# Patient Record
Sex: Male | Born: 1955 | Race: Black or African American | Hispanic: No | Marital: Single | Smoking: Never smoker
Health system: Southern US, Community
[De-identification: ages and names within clinical notes are randomized; demographics above are authoritative.]

## PROBLEM LIST (undated history)

## (undated) DIAGNOSIS — F419 Anxiety disorder, unspecified: Secondary | ICD-10-CM

## (undated) DIAGNOSIS — I10 Essential (primary) hypertension: Secondary | ICD-10-CM

## (undated) DIAGNOSIS — F32A Depression, unspecified: Secondary | ICD-10-CM

## (undated) DIAGNOSIS — E785 Hyperlipidemia, unspecified: Secondary | ICD-10-CM

## (undated) DIAGNOSIS — F329 Major depressive disorder, single episode, unspecified: Secondary | ICD-10-CM

## (undated) HISTORY — DX: Essential (primary) hypertension: I10

## (undated) HISTORY — DX: Depression, unspecified: F32.A

## (undated) HISTORY — DX: Anxiety disorder, unspecified: F41.9

## (undated) HISTORY — DX: Hyperlipidemia, unspecified: E78.5

## (undated) HISTORY — PX: OTHER SURGICAL HISTORY: SHX169

## (undated) HISTORY — DX: Major depressive disorder, single episode, unspecified: F32.9

---

## 2003-04-10 ENCOUNTER — Encounter (INDEPENDENT_AMBULATORY_CARE_PROVIDER_SITE_OTHER): Payer: Self-pay | Admitting: Internal Medicine

## 2006-03-20 ENCOUNTER — Emergency Department: Payer: Self-pay | Admitting: Unknown Physician Specialty

## 2006-08-06 ENCOUNTER — Ambulatory Visit: Payer: Self-pay | Admitting: Family Medicine

## 2007-01-27 ENCOUNTER — Encounter (INDEPENDENT_AMBULATORY_CARE_PROVIDER_SITE_OTHER): Payer: Self-pay | Admitting: Internal Medicine

## 2007-01-27 DIAGNOSIS — E785 Hyperlipidemia, unspecified: Secondary | ICD-10-CM | POA: Insufficient documentation

## 2007-01-27 DIAGNOSIS — F528 Other sexual dysfunction not due to a substance or known physiological condition: Secondary | ICD-10-CM | POA: Insufficient documentation

## 2007-01-27 DIAGNOSIS — F329 Major depressive disorder, single episode, unspecified: Secondary | ICD-10-CM

## 2007-01-27 DIAGNOSIS — F3289 Other specified depressive episodes: Secondary | ICD-10-CM | POA: Insufficient documentation

## 2007-01-27 DIAGNOSIS — I10 Essential (primary) hypertension: Secondary | ICD-10-CM | POA: Insufficient documentation

## 2007-01-27 DIAGNOSIS — M542 Cervicalgia: Secondary | ICD-10-CM

## 2007-01-27 DIAGNOSIS — F411 Generalized anxiety disorder: Secondary | ICD-10-CM | POA: Insufficient documentation

## 2007-01-27 LAB — CONVERTED CEMR LAB: Cholesterol, target level: 200 mg/dL

## 2007-01-28 ENCOUNTER — Ambulatory Visit: Payer: Self-pay | Admitting: Family Medicine

## 2007-01-28 DIAGNOSIS — R6882 Decreased libido: Secondary | ICD-10-CM

## 2007-01-29 ENCOUNTER — Ambulatory Visit: Payer: Self-pay | Admitting: Internal Medicine

## 2007-01-30 LAB — CONVERTED CEMR LAB
ALT: 13 units/L (ref 0–40)
AST: 16 units/L (ref 0–37)
Albumin: 4 g/dL (ref 3.5–5.2)
Calcium: 9.3 mg/dL (ref 8.4–10.5)
Chloride: 106 meq/L (ref 96–112)
Direct LDL: 155 mg/dL
GFR calc Af Amer: 83 mL/min
GFR calc non Af Amer: 69 mL/min
PSA: 0.31 ng/mL (ref 0.10–4.00)
Total CHOL/HDL Ratio: 5
Triglycerides: 128 mg/dL (ref 0–149)
VLDL: 26 mg/dL (ref 0–40)

## 2007-02-23 ENCOUNTER — Telehealth (INDEPENDENT_AMBULATORY_CARE_PROVIDER_SITE_OTHER): Payer: Self-pay | Admitting: *Deleted

## 2007-03-02 ENCOUNTER — Ambulatory Visit: Payer: Self-pay | Admitting: Family Medicine

## 2007-03-02 DIAGNOSIS — M25519 Pain in unspecified shoulder: Secondary | ICD-10-CM

## 2007-03-03 ENCOUNTER — Encounter (INDEPENDENT_AMBULATORY_CARE_PROVIDER_SITE_OTHER): Payer: Self-pay | Admitting: Internal Medicine

## 2007-04-01 ENCOUNTER — Telehealth (INDEPENDENT_AMBULATORY_CARE_PROVIDER_SITE_OTHER): Payer: Self-pay | Admitting: *Deleted

## 2007-04-01 ENCOUNTER — Telehealth (INDEPENDENT_AMBULATORY_CARE_PROVIDER_SITE_OTHER): Payer: Self-pay | Admitting: Internal Medicine

## 2007-04-08 ENCOUNTER — Encounter (INDEPENDENT_AMBULATORY_CARE_PROVIDER_SITE_OTHER): Payer: Self-pay | Admitting: Internal Medicine

## 2007-07-31 ENCOUNTER — Encounter (INDEPENDENT_AMBULATORY_CARE_PROVIDER_SITE_OTHER): Payer: Self-pay | Admitting: *Deleted

## 2007-08-20 ENCOUNTER — Ambulatory Visit: Payer: Self-pay | Admitting: Family Medicine

## 2007-08-20 DIAGNOSIS — K5289 Other specified noninfective gastroenteritis and colitis: Secondary | ICD-10-CM

## 2008-01-20 ENCOUNTER — Encounter (INDEPENDENT_AMBULATORY_CARE_PROVIDER_SITE_OTHER): Payer: Self-pay | Admitting: Internal Medicine

## 2008-02-05 ENCOUNTER — Telehealth (INDEPENDENT_AMBULATORY_CARE_PROVIDER_SITE_OTHER): Payer: Self-pay | Admitting: Internal Medicine

## 2008-02-08 ENCOUNTER — Telehealth (INDEPENDENT_AMBULATORY_CARE_PROVIDER_SITE_OTHER): Payer: Self-pay | Admitting: *Deleted

## 2008-03-07 ENCOUNTER — Encounter (INDEPENDENT_AMBULATORY_CARE_PROVIDER_SITE_OTHER): Payer: Self-pay | Admitting: Internal Medicine

## 2008-03-09 ENCOUNTER — Encounter (INDEPENDENT_AMBULATORY_CARE_PROVIDER_SITE_OTHER): Payer: Self-pay | Admitting: Internal Medicine

## 2008-03-09 ENCOUNTER — Ambulatory Visit: Payer: Self-pay | Admitting: Family Medicine

## 2008-03-10 ENCOUNTER — Encounter: Admission: RE | Admit: 2008-03-10 | Discharge: 2008-03-10 | Payer: Self-pay | Admitting: Family Medicine

## 2008-03-10 ENCOUNTER — Ambulatory Visit: Payer: Self-pay | Admitting: Internal Medicine

## 2008-03-15 LAB — CONVERTED CEMR LAB
Alkaline Phosphatase: 64 units/L (ref 39–117)
Basophils Absolute: 0 10*3/uL (ref 0.0–0.1)
Bilirubin, Direct: 0.1 mg/dL (ref 0.0–0.3)
Calcium: 9.2 mg/dL (ref 8.4–10.5)
Cholesterol: 226 mg/dL (ref 0–200)
GFR calc Af Amer: 92 mL/min
GFR calc non Af Amer: 76 mL/min
Glucose, Bld: 101 mg/dL — ABNORMAL HIGH (ref 70–99)
HCT: 46.9 % (ref 39.0–52.0)
Hemoglobin: 15.8 g/dL (ref 13.0–17.0)
MCHC: 33.6 g/dL (ref 30.0–36.0)
Monocytes Absolute: 0.5 10*3/uL (ref 0.1–1.0)
Monocytes Relative: 7.9 % (ref 3.0–12.0)
Neutro Abs: 3 10*3/uL (ref 1.4–7.7)
Platelets: 289 10*3/uL (ref 150–400)
Potassium: 4.2 meq/L (ref 3.5–5.1)
RDW: 13.8 % (ref 11.5–14.6)
Sodium: 142 meq/L (ref 135–145)
TSH: 0.88 microintl units/mL (ref 0.35–5.50)
Total Bilirubin: 0.6 mg/dL (ref 0.3–1.2)
Total CHOL/HDL Ratio: 5.1
Total Protein: 6.5 g/dL (ref 6.0–8.3)
Triglycerides: 104 mg/dL (ref 0–149)
VLDL: 21 mg/dL (ref 0–40)

## 2008-03-16 ENCOUNTER — Telehealth (INDEPENDENT_AMBULATORY_CARE_PROVIDER_SITE_OTHER): Payer: Self-pay | Admitting: *Deleted

## 2008-03-31 ENCOUNTER — Ambulatory Visit (HOSPITAL_COMMUNITY): Admission: RE | Admit: 2008-03-31 | Discharge: 2008-04-01 | Payer: Self-pay | Admitting: Specialist

## 2008-04-09 HISTORY — PX: ANTERIOR CERVICAL DECOMP/DISCECTOMY FUSION: SHX1161

## 2008-05-23 ENCOUNTER — Encounter (INDEPENDENT_AMBULATORY_CARE_PROVIDER_SITE_OTHER): Payer: Self-pay | Admitting: Internal Medicine

## 2008-06-01 ENCOUNTER — Encounter (INDEPENDENT_AMBULATORY_CARE_PROVIDER_SITE_OTHER): Payer: Self-pay | Admitting: *Deleted

## 2008-06-09 ENCOUNTER — Telehealth (INDEPENDENT_AMBULATORY_CARE_PROVIDER_SITE_OTHER): Payer: Self-pay | Admitting: Internal Medicine

## 2008-07-07 ENCOUNTER — Ambulatory Visit: Payer: Self-pay | Admitting: Family Medicine

## 2008-08-26 ENCOUNTER — Encounter (INDEPENDENT_AMBULATORY_CARE_PROVIDER_SITE_OTHER): Payer: Self-pay | Admitting: Internal Medicine

## 2008-08-26 ENCOUNTER — Telehealth (INDEPENDENT_AMBULATORY_CARE_PROVIDER_SITE_OTHER): Payer: Self-pay | Admitting: Internal Medicine

## 2008-09-15 ENCOUNTER — Ambulatory Visit: Payer: Self-pay | Admitting: Family Medicine

## 2008-09-15 DIAGNOSIS — G4700 Insomnia, unspecified: Secondary | ICD-10-CM

## 2008-12-13 ENCOUNTER — Telehealth (INDEPENDENT_AMBULATORY_CARE_PROVIDER_SITE_OTHER): Payer: Self-pay | Admitting: Internal Medicine

## 2009-02-15 ENCOUNTER — Telehealth (INDEPENDENT_AMBULATORY_CARE_PROVIDER_SITE_OTHER): Payer: Self-pay | Admitting: Internal Medicine

## 2009-09-15 ENCOUNTER — Ambulatory Visit: Payer: Self-pay | Admitting: Family Medicine

## 2009-09-20 LAB — CONVERTED CEMR LAB
ALT: 14 units/L (ref 0–53)
AST: 18 units/L (ref 0–37)
CO2: 32 meq/L (ref 19–32)
Calcium: 9.3 mg/dL (ref 8.4–10.5)
Chloride: 105 meq/L (ref 96–112)
Glucose, Bld: 89 mg/dL (ref 70–99)
HDL: 44.3 mg/dL (ref >39.00)
Potassium: 4.1 meq/L (ref 3.5–5.1)
Sodium: 143 meq/L (ref 135–145)
Triglycerides: 91 mg/dL (ref 0.0–149.0)
VLDL: 18.2 mg/dL (ref 0.0–40.0)

## 2009-09-28 ENCOUNTER — Ambulatory Visit: Payer: Self-pay | Admitting: Family Medicine

## 2009-12-13 ENCOUNTER — Telehealth: Payer: Self-pay | Admitting: Family Medicine

## 2010-03-04 IMAGING — CR DG CHEST 2V
2 series · 2 of 2 positions shown · non-contrast
Comparison: None

CLINICAL DATA: Preoperative respiratory exam for neck surgery

CHEST - 2 VIEW

[w chest pa]
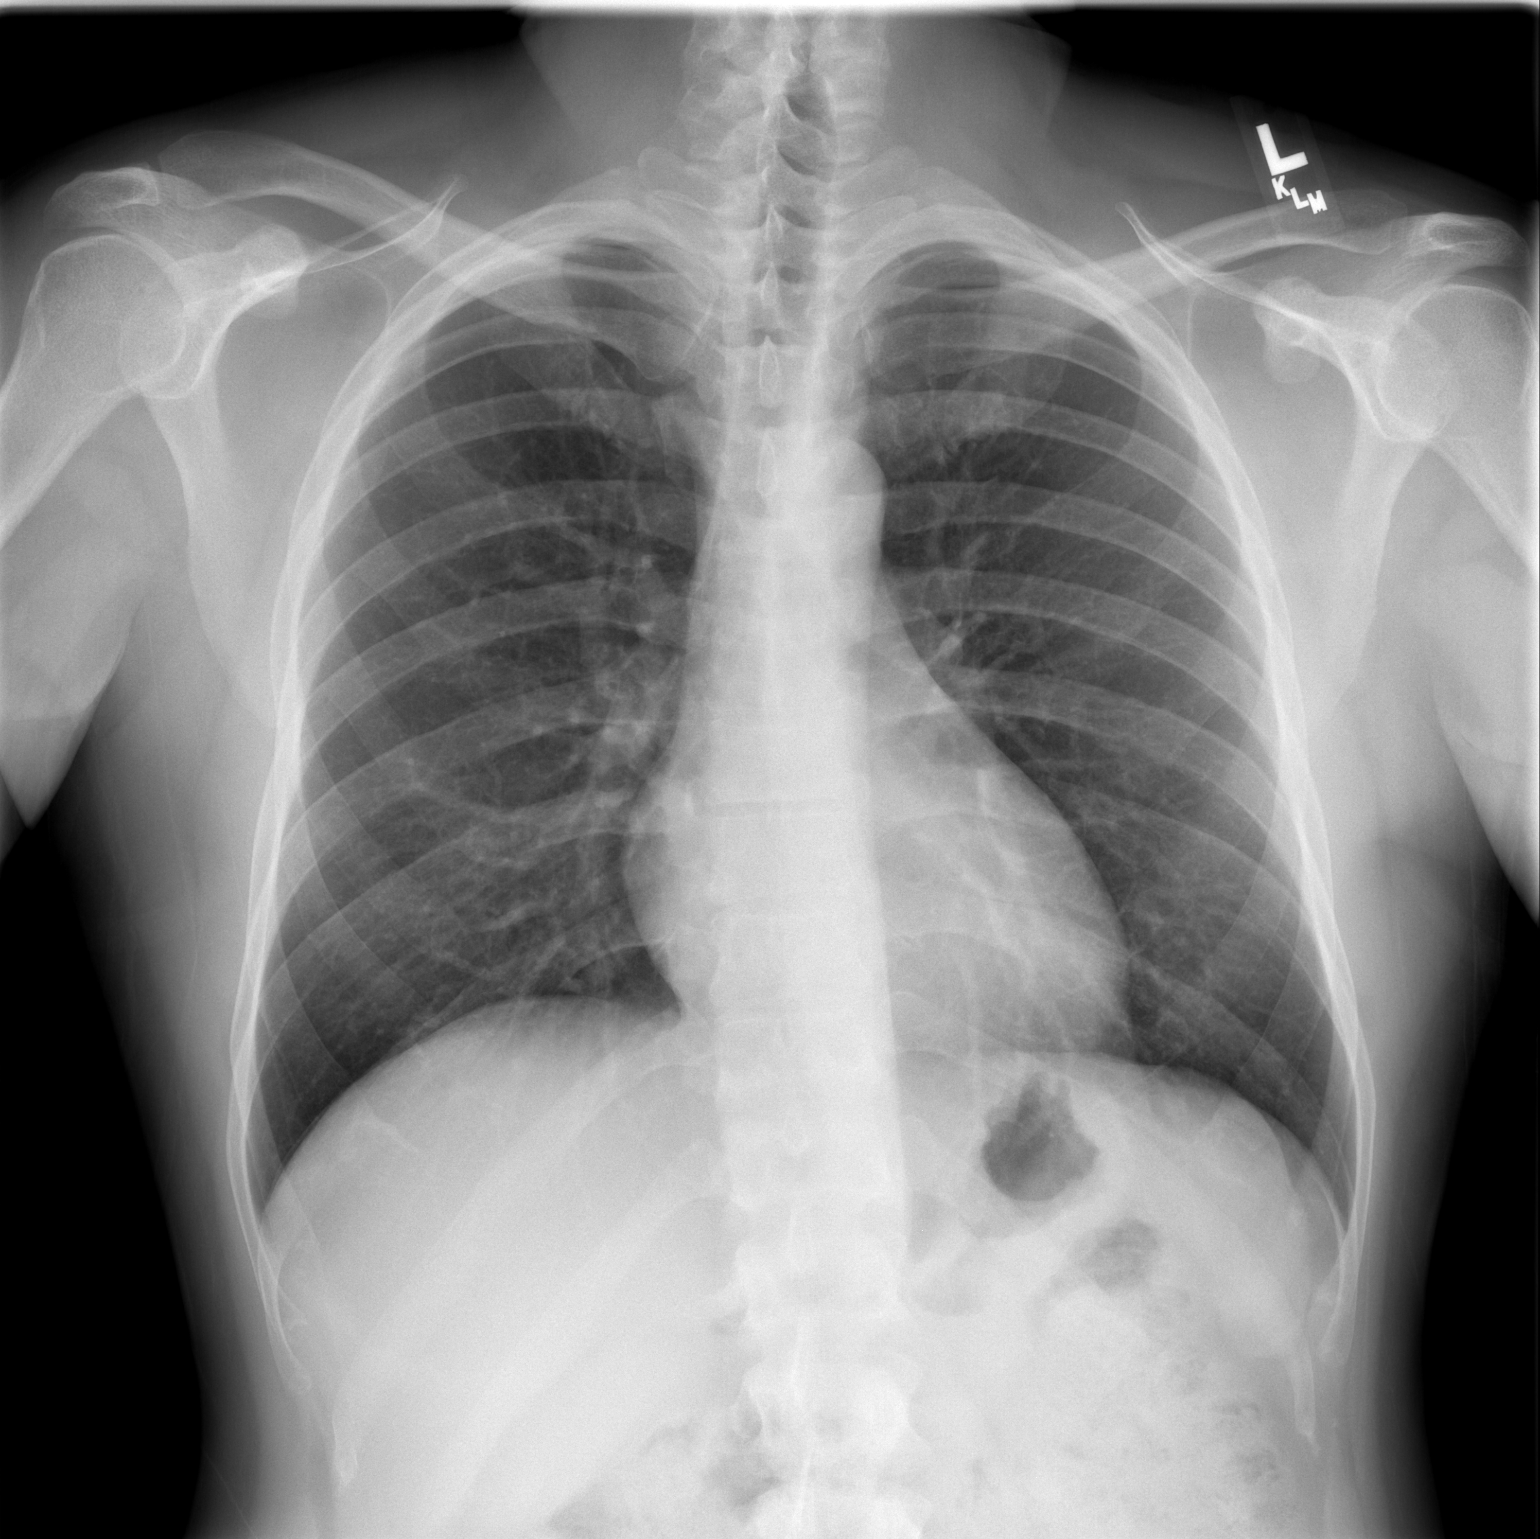

[w chest lat]
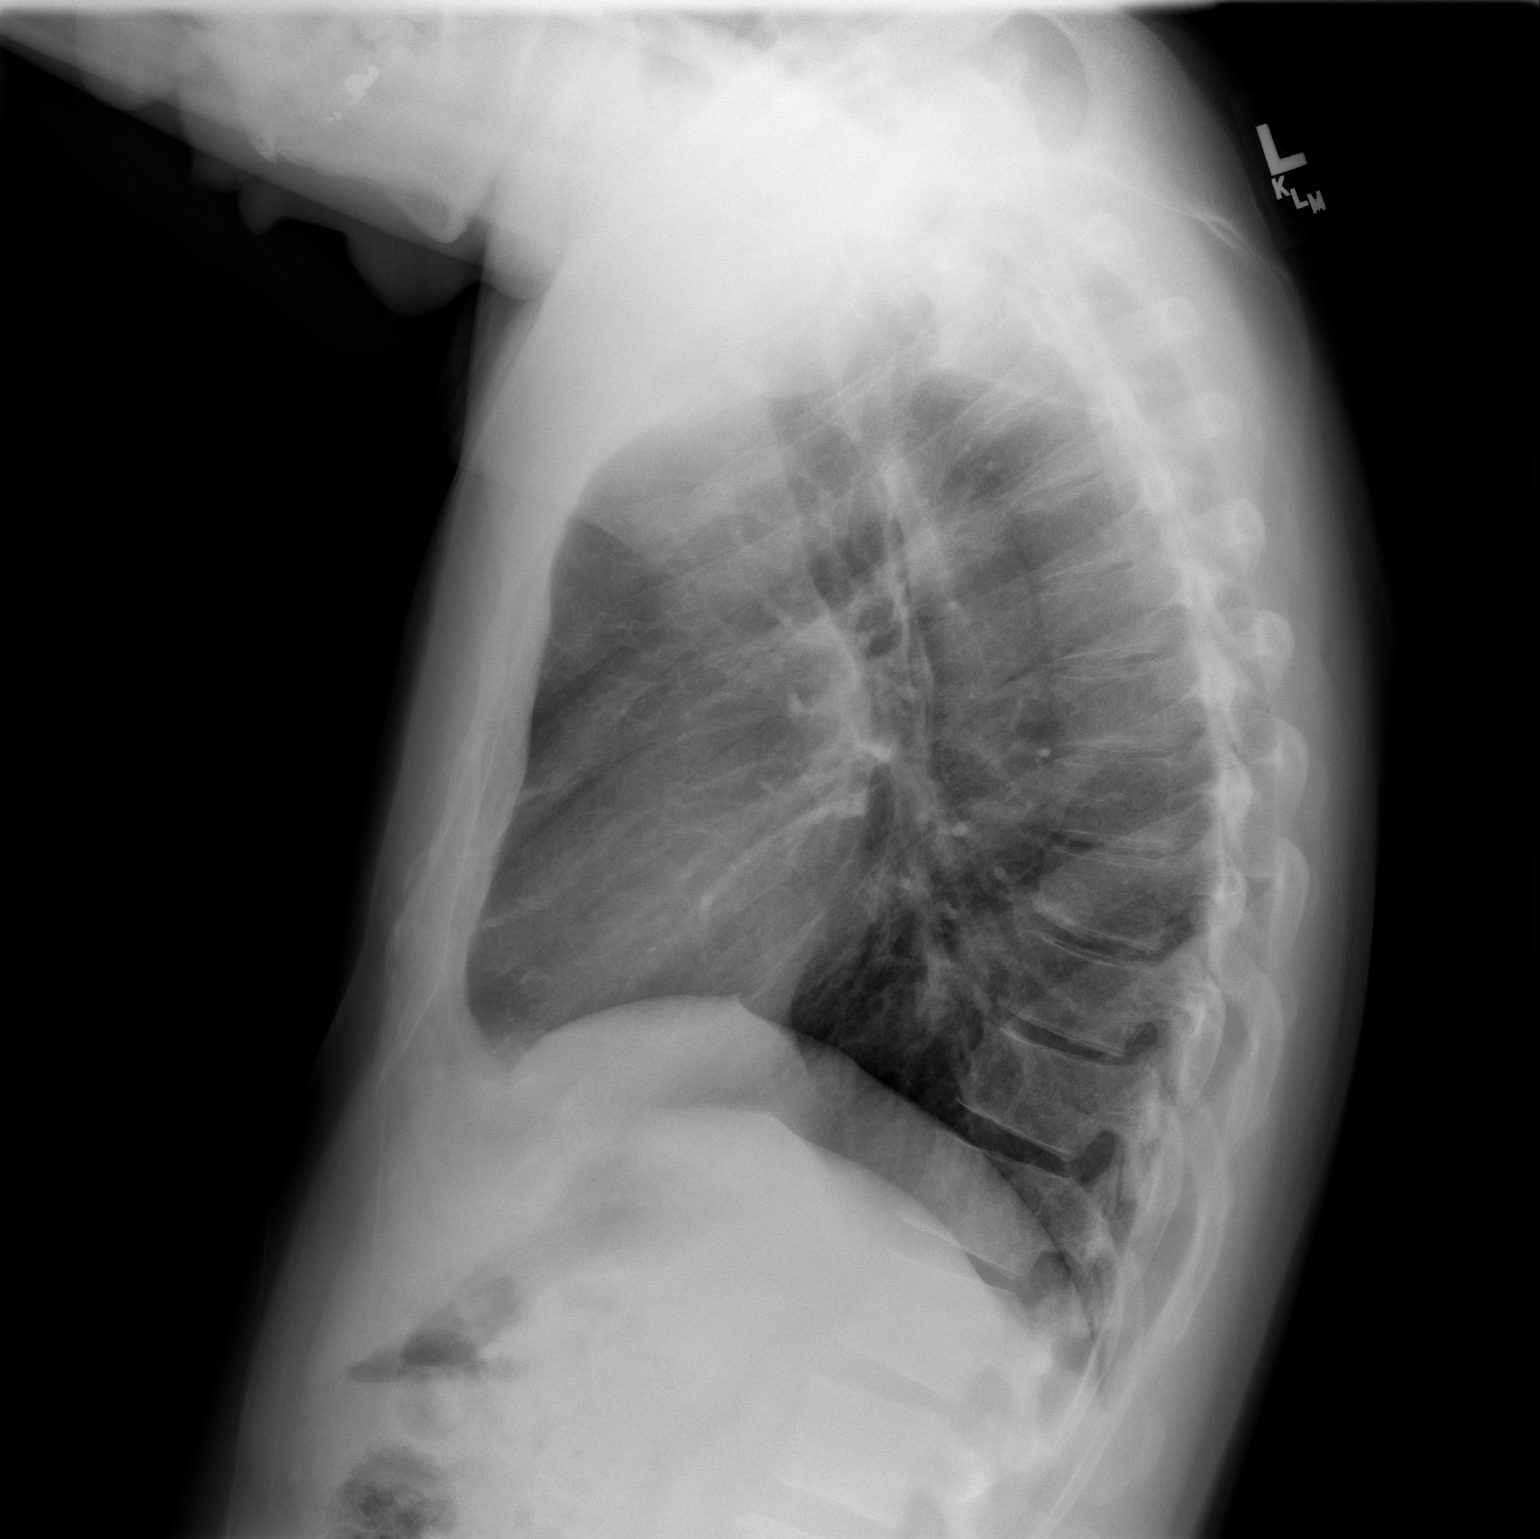

[2 of 2 positions shown; findings below may reference images not displayed]

FINDINGS: The heart size and mediastinal contours are within normal
limits.  Both lungs are clear.  The visualized skeletal structures
are unremarkable.
IMPRESSION: No active disease.

## 2010-08-27 ENCOUNTER — Telehealth: Payer: Self-pay | Admitting: Family Medicine

## 2010-10-09 NOTE — Assessment & Plan Note (Signed)
Summary: FOLLOW UP, DISCUSS LABS PER BILLIE/NT   Vital Signs:  Patient profile:   55 year old male Height:      67 inches Weight:      189.2 pounds BMI:     29.74 Temp:     98.3 degrees F oral Pulse rate:   72 / minute Pulse rhythm:   regular BP sitting:   106 / 72  (left arm) Cuff size:   regular  Vitals Entered By: Benny Lennert CMA Duncan Dull) (September 28, 2009 8:14 AM)  History of Present Illness: Chief complaint follow ups labs per billie  55 year old male:  Lipids: elevated, was on Zocor before, but stopped  Erections: Has not had sex for a couple of months, he is able to achieve an erection sometimes, not as hard, he can ejaculate with diminished ejaculate, is able to masturbate, but in general his erectile quality is poor.  At work, got some shortness of breath, laid back for a minute. Not associ with exercise. Started to get some sweating. Came down. Left his medicine at home that one time.  Only when missing medicine - atenolol  Has had a stress test that was normal  ? Testosterone deficiency question by Everrett Coombe who asked me to addresss.  Allergies: 1)  ! Pcn  Past History:  Past medical, surgical, family and social histories (including risk factors) reviewed, and no changes noted (except as noted below).  Past Medical History: Reviewed history from 01/27/2007 and no changes required. Hypertension Hyperlipidemia Anxiety Depression  Past Surgical History: Reviewed history from 08/26/2008 and no changes required. BONE SPURS, R) SHOULDER 2005 discectomy and fusion C3-4 and C4-5---8/09--Nitka  Family History: Reviewed history from 09/15/2008 and no changes required. Father:--did not know father  Mother: died of stomach cancer at age of 8  Siblings: 1 sis--died of ?--at age 12  Social History: Reviewed history from 09/15/2009 and no changes required. 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 GI: No noted N or V Otherwise, pertinent positives and negatives are noted in the HPI.   Physical Exam  General:  Well-developed,well-nourished,in no acute distress; alert,appropriate and cooperative throughout examination Head:  Normocephalic and atraumatic without obvious abnormalities. No apparent alopecia or balding. Ears:  no external deformities.   Nose:  no external deformity.   Neck:  No deformities, masses, or tenderness noted. Lungs:  Normal respiratory effort, chest expands symmetrically. Lungs are clear to auscultation, no crackles or wheezes. Heart:  Normal rate and regular rhythm. S1 and S2 normal without gallop, murmur, click, rub or other extra sounds. Extremities:  No clubbing, cyanosis, edema, or deformity noted with normal full range of motion of all joints.   Neurologic:  alert & oriented X3 and gait normal.   Cervical Nodes:  No lymphadenopathy noted Psych:  Cognition and judgment appear intact. Alert and cooperative with normal attention span and concentration. No apparent delusions, illusions, hallucinations   Impression & Recommendations:  Problem # 1:  HYPERLIPIDEMIA (ICD-272.4)  I would stop Crestor and start Pravachol - excellent medicine at a fraction of the cost  The following medications were removed from the medication list:    Crestor 40  Mg Tabs (Rosuvastatin calcium) .Marland Kitchen... 1 once daily for cholesterol His updated medication list for this problem includes:    Pravastatin Sodium 40 Mg Tabs (Pravastatin sodium) ..... One by mouth at bedtime  Labs Reviewed: SGOT: 18 (09/15/2009)   SGPT: 14 (09/15/2009)  Lipid Goals: Chol Goal: 200 (01/27/2007)    HDL Goal: 40 (01/27/2007)   LDL Goal: 130 (01/27/2007)   TG Goal: 150 (01/27/2007)  Prior 10 Yr Risk Heart Disease: Not enough information (01/27/2007)   HDL:44.30 (09/15/2009), 43.9 (03/10/2008)  LDL:DEL (03/10/2008), DEL (01/29/2007)  Chol:231 (09/15/2009), 226 (03/10/2008)  Trig:91.0 (09/15/2009), 104 (03/10/2008)  Problem # 2:  ERECTILE DYSFUNCTION (ICD-302.72) Start viagra trial, 1/2 tab prior to intercourse  His updated medication list for this problem includes:    Viagra 100 Mg Tabs (Sildenafil citrate) .Marland Kitchen... Take 1 tablet prior to intercourse, 30 minutes before  Problem # 3:  DECREASED LIBIDO (ICD-799.81) the patient does not meet diagnosis of hypogonadism  by the American endocrinological Association  following these guidelines, testosterone supplementation as not indicated. I discussed this with the patient, and his age, a testosterone of 353 is not markedly low.  It is minimally low, it may explain some of his libido issues as some of his fatigue  Complete Medication List: 1)  Atenolol 25 Mg Tabs (Atenolol) .... Take 1 tablet by mouth two times a day 2)  Effexor Xr 150 Mg Cp24 (Venlafaxine hcl) .... Once daily 3)  Verapamil Hcl Cr 360 Mg Cp24 (Verapamil hcl) .... Take 1 tablet by mouth once a day 4)  Ambien 10 Mg Tabs (Zolpidem tartrate) .... 1/2 to 1 at bedtime for sleep 5)  Effexor Xr 75 Mg Xr24h-cap (Venlafaxine hcl) .Marland Kitchen.. 1 every other day and 150 mg on odd days x 1-2 mo, then move to 1 once daily if tolerated 6)  Pravastatin Sodium 40 Mg Tabs (Pravastatin sodium) .... One by mouth at bedtime 7)  Viagra 100 Mg Tabs (Sildenafil citrate) .... Take 1 tablet prior to intercourse, 30 minutes before  Patient Instructions: 1)  f/u 3 months 2)  before, several days 3)  recheck: 4)  Hepatic Panel prior to visit ICD-9: v58.69 5)  Lipid panel prior to visit ICD-9 : 272.4 Prescriptions: VIAGRA 100 MG TABS (SILDENAFIL CITRATE) Take 1 tablet prior to intercourse, 30 minutes before   #10 x 11   Entered and Authorized by:   Hannah Beat MD   Signed by:   Hannah Beat MD on 09/28/2009   Method used:   [facility]        [facility]  [place] [id number]       [address]       [place], [place]  [address]       Ph: [phone] or [phone]       Fax: [phone]   RxID:   [id number] PRAVASTATIN SODIUM 40 MG  TABS (PRAVASTATIN SODIUM) one by mouth at bedtime  #30 x 5   Entered and Authorized by:   Hannah Beat MD   Signed by:   Hannah Beat MD on 09/28/2009   Method used:   [facility]        [facility]  [place] (retail)       [address]       [place], [place]  [address]       Ph: [phone] or [phone]       Fax: [phone]   RxID:   [id number]   Current Allergies (reviewed today): ! PCN  Appended  Document: FOLLOW UP, DISCUSS LABS PER BILLIE/NT Call patient  Make sure he is not taking Crestor  Billie wrote him for this, but it is the same type of medicine as the pravastatin for cholesterol, but it costs 10 times as much. I would take the generic.  Appended Document: FOLLOW UP, DISCUSS LABS PER BILLIE/NT Patient actually out of medication wants to change to prevastatin and [facility] [place]  Appended Document: FOLLOW UP, DISCUSS LABS PER BILLIE/NT he was given pravastatin for the 1st time yesterday, and was already sent [facility] [place]  ensure not taking Crestor  Appended Document: FOLLOW UP, DISCUSS LABS PER BILLIE/NT okay he said that he doesnt have any he ran out

## 2010-10-09 NOTE — Progress Notes (Signed)
Summary: refill request for ambien  Phone Note Refill Request Message from:  Fax from Pharmacy  Refills Requested: Medication #1:  AMBIEN 10 MG TABS 1/2 to 1 at bedtime for sleep   Last Refilled: 11/04/2009 Faxed request from [facility], phone [phone].  Initial call taken by: Lowella Petties CMA,  December 13, 2009 10:39 AM  Follow-up for Phone Call        Rx called to pharmacy Follow-up by: Benny Lennert CMA Duncan Dull),  December 13, 2009 12:21 PM    Prescriptions: AMBIEN 10 MG TABS (ZOLPIDEM TARTRATE) 1/2 to 1 at bedtime for sleep  #30 x 5   Entered and Authorized by:   Hannah Beat MD   Signed by:   Hannah Beat MD on 12/13/2009   Method used:   Telephoned to ...       [facility]  [place] (retail)       [address]       [place], [place]  [address]       Ph: [phone] or [phone]       Fax: [phone]   RxID:   [id number]

## 2010-10-09 NOTE — Assessment & Plan Note (Signed)
Summary: CPX/CLE   Vital Signs:  Patient profile:   55 year old male Height:      67 inches Weight:      187 pounds BMI:     29.39 Temp:     98.9 degrees F oral Pulse rate:   68 / minute Pulse rhythm:   regular BP sitting:   136 / 94  (left arm) Cuff size:   regular  Vitals Entered By: Lewanda Rife LPN (September 15, 2009 9:03 AM)  CC:  complete physical.  History of Present Illness: Here for CPX-- --feels is ready to get off Effexor--depressed at time of divorce, now feeling well, has daughter living with him --having problems with erectile dysfunction--reports this is not a new problem, he has used patches of testosterone in the past--would like to have his testosterone checked and tx if low    Preventive Screening-Counseling & Management  Alcohol-Tobacco     Alcohol drinks/day: 1     Alcohol type: beer     Smoking Status: quit     Packs/Day: 0.5     Year Started: 1973     Year Quit: 1998  Caffeine-Diet-Exercise     Caffeine use/day: 4     Does Patient Exercise: no  Problems Prior to Update: 1)  Insomnia  (ICD-780.52) 2)  Other Specified Pre-operative Examination  (ICD-V72.83) 3)  Gastroenteritis  (ICD-558.9) 4)  Shoulder Pain, Right  (ICD-719.41) 5)  Screening For Malignant Neoplasm, Prostate  (ICD-V76.44) 6)  Decreased Libido  (ICD-799.81) 7)  Well Adult  (ICD-V70.0) 8)  Thyroid Stimulating Hormone, Abnormal, Decreased  (ICD-246.9) 9)  Neck Pain  (ICD-723.1) 10)  Erectile Dysfunction  (ICD-302.72) 11)  Depression  (ICD-311) 12)  Anxiety  (ICD-300.00) 13)  Hyperlipidemia  (ICD-272.4) 14)  Hypertension  (ICD-401.9)  Medications Prior to Update: 1)  Atenolol 25 Mg Tabs (Atenolol) .... Take 1 Tablet By Mouth Two Times A Day 2)  Effexor Xr 150 Mg Cp24 (Venlafaxine Hcl) .... Once Daily 3)  Verapamil Hcl Cr 360 Mg Cp24 (Verapamil Hcl) .... Take 1 Tablet By Mouth Once A Day 4)  Crestor 40 Mg Tabs (Rosuvastatin Calcium) .Marland Kitchen.. 1 Once Daily For Cholesterol 5)  Ambien  10 Mg Tabs (Zolpidem Tartrate) .... 1/2 To 1 At Bedtime For Sleep  Allergies: 1)  ! 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[employer], 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, 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, shipping and receiving, 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  Risk Factors: Alcohol Use: 1 (09/15/2009) Caffeine Use: 4 (09/15/2009) Exercise: no (09/15/2009)  Risk Factors: Smoking Status: quit (09/15/2009) Packs/Day: 0.5 (09/15/2009) Passive Smoke Exposure: no (03/09/2008)  Social History: Marital Status: Married--09/2009--now divorced, daughter lives with him--now 16 Children: 2--13 daughter, 16 son 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 CV:  Denies chest pain or discomfort, palpitations, swelling of feet, and swelling of hands. Resp:  Denies cough, shortness of breath, and wheezing. GI:  Denies abdominal pain, bloody stools, constipation, diarrhea, indigestion, nausea, and vomiting. GU:  Complains of erectile dysfunction; denies discharge, incontinence, and nocturia. MS:  Denies joint pain and  muscle aches. Derm:  Complains of rash; denies lesion(s); spots all over--back, head, shoulders--used . Neuro:  Denies difficulty with concentration, disturbances in coordination, falling down, memory loss, seizures, tremors, and weakness. Psych:  See HPI; Denies anxiety and depression.  Physical Exam  General:  alert, well-developed, well-nourished, and well-hydrated.   Neck:  no masses, no thyromegaly, no JVD, and no carotid bruits.   Lungs:  normal respiratory effort, no intercostal retractions, no accessory muscle use, and normal breath sounds.   Heart:  normal rate, regular rhythm, and no murmur.   Abdomen:  soft, non-tender, normal bowel sounds, no distention, no masses, no abdominal hernia, no inguinal hernia, no hepatomegaly, and no splenomegaly.   Rectal:  normal sphincter tone and no masses.  guiac neg Prostate:   ~30mg , no nodules and no asymmetry.   Msk:  no joint swelling, no joint warmth, no redness over joints, and no joint deformities.   Extremities:  no edema either lower leg Neurologic:  alert & oriented X3, sensation intact to light touch, and gait normal.   Inguinal Nodes:  no R inguinal adenopathy and no L inguinal adenopathy.   Psych:  normally interactive and good eye contact.     Impression & Recommendations:  Problem # 1:  WELL ADULT (ICD-V70.0) well 55 yr old male will update immunizations--Tdap given discussed colonoscopy--wants to hold on this for now, no family hx of colon cancer, given stool cards  Problem # 2:  DECREASED LIBIDO (ICD-799.81) Assessment: Unchanged will check testosterone today--discussed with him his risk factors, ie HBP, elevated llipids--treated will discuss use of testosterone with Dr Hetty Ely based on lab results Orders: TLB-Testosterone, Total (84403-TESTO)  Problem # 3:  DEPRESSION (ICD-311) Assessment: Improved will titrate off effexor: to use 75 mg every other day with 150 on other days for 1-2 mo, then try to reduce to 75mg   on daily basis His updated medication list for this problem includes:    Effexor Xr 150 Mg Cp24 (Venlafaxine hcl) ..... Once daily    Effexor Xr 75 Mg Xr24h-cap (Venlafaxine hcl) .Marland Kitchen... 1 every other day and 150 mg on odd days x 1-2 mo, then move to 1 once daily if tolerated  Problem # 4:  HYPERTENSION (ICD-401.9) Assessment: Unchanged stable on current meds--continue see back in 6 mo or as needed  His updated medication list for this problem includes:    Atenolol 25 Mg Tabs (Atenolol) .Marland Kitchen... Take 1 tablet by mouth two times a day    Verapamil Hcl Cr 360 Mg Cp24 (Verapamil hcl) .Marland Kitchen... Take 1 tablet by mouth once a day  Orders: Venipuncture (86578) TLB-BMP (Basic Metabolic Panel-BMET) (80048-METABOL)  BP today: 136/94 Prior BP: 124/90 (09/15/2008)  Prior 10 Yr Risk Heart Disease: Not enough information (01/27/2007)  Labs Reviewed: K+: 4.2 (03/10/2008) Creat: : 1.1 (03/10/2008)   Chol: 226 (03/10/2008)   HDL: 43.9 (03/10/2008)   LDL: DEL (03/10/2008)   TG: 104 (03/10/2008)  Problem # 5:   HYPERLIPIDEMIA (ICD-272.4) Assessment: Unchanged no recent labs, will get labs today and titrate if needed His updated medication list for this problem includes:    Crestor 40 Mg Tabs (Rosuvastatin calcium) .Marland KitchenMarland KitchenMarland KitchenMarland Kitchen  1 once daily for cholesterol  Labs Reviewed: SGOT: 18 (03/10/2008)   SGPT: 13 (03/10/2008)  Lipid Goals: Chol Goal: 200 (01/27/2007)   HDL Goal: 40 (01/27/2007)   LDL Goal: 130 (01/27/2007)   TG Goal: 150 (01/27/2007)  Prior 10 Yr Risk Heart Disease: Not enough information (01/27/2007)   HDL:43.9 (03/10/2008), 49.1 (01/29/2007)  LDL:DEL (03/10/2008), DEL (01/29/2007)  Chol:226 (03/10/2008), 245 (01/29/2007)  Trig:104 (03/10/2008), 128 (01/29/2007)  Complete Medication List: 1)  Atenolol 25 Mg Tabs (Atenolol) .... Take 1 tablet by mouth two times a day 2)  Effexor Xr 150 Mg Cp24 (Venlafaxine hcl) .... Once daily 3)  Verapamil Hcl Cr 360 Mg Cp24 (Verapamil hcl) .... Take 1 tablet by  mouth once a day 4)  Crestor 40 Mg Tabs (Rosuvastatin calcium) .Marland Kitchen.. 1 once daily for cholesterol 5)  Ambien 10 Mg Tabs (Zolpidem tartrate) .... 1/2 to 1 at bedtime for sleep 6)  Effexor Xr 75 Mg Xr24h-cap (Venlafaxine hcl) .Marland Kitchen.. 1 every other day and 150 mg on odd days x 1-2 mo, then move to 1 once daily if tolerated  Other Orders: TLB-Lipid Panel (80061-LIPID) TLB-ALT (SGPT) (84460-ALT) TLB-AST (SGOT) (84450-SGOT) Tdap => 30yrs IM ([address]) Admin 1st Vaccine (60454) Prescriptions: EFFEXOR XR 75 MG XR24H-CAP (VENLAFAXINE HCL) 1 every other day and 150 mg on odd days x 1-2 mo, then move to 1 once daily if tolerated  #15 x 2   Entered and Authorized by:   Gildardo Griffes FNP   Signed by:   Gildardo Griffes FNP on 09/19/2009   Method used:   [facility]        [facility]  [place] [id number]       [address]       [place], [place]  [address]       Ph: [phone] or [phone]       Fax: [phone]   RxID:   [id number] AMBIEN 10 MG TABS (ZOLPIDEM TARTRATE) 1/2 to 1 at bedtime for sleep  #30 x 1   Entered and Authorized by:   Gildardo Griffes FNP   Signed by:   Gildardo Griffes FNP on 09/15/2009   Method used:   Print then Give to Patient   RxID:   [id number]   Current Allergies (reviewed today): ! PCN   Immunizations Administered:  Tetanus Vaccine:    Vaccine Type: Tdap    Site: right deltoid    Mfr: GlaxoSmithKline    Dose: 0.5 ml    Route: IM    Given by: Lewanda Rife LPN    Exp. Date: 11/04/2011    Lot #: [id number]    VIS given: 07/28/07 version given September 15, 2009.

## 2010-10-11 NOTE — Progress Notes (Signed)
Summary: Remus Loffler  Phone Note Refill Request Message from:  Fax from Pharmacy on August 27, 2010 9:49 AM  Refills Requested: Medication #1:  AMBIEN 10 MG TABS 1/2 to 1 at bedtime for sleep   Last Refilled: 06/05/2010 [facility] [phone].  Initial call taken by: Melody Comas,  August 27, 2010 9:49 AM Call For: Hannah Beat MD  Follow-up for Phone Call        Rx called to pharmacy Follow-up by: Benny Lennert CMA Duncan Dull),  August 27, 2010 10:11 AM    Prescriptions: AMBIEN 10 MG TABS (ZOLPIDEM TARTRATE) 1/2 to 1 at bedtime for sleep  #30 x 5   Entered and Authorized by:   Hannah Beat MD   Signed by:   Hannah Beat MD on 08/27/2010   Method used:   Telephoned to ...       [facility]  [place] ([facility]ail)       [address]       [place], [place]  [address]       Ph: [phone] or [phone]       Fax: [phone]   RxID:   [id number]

## 2010-12-22 ENCOUNTER — Other Ambulatory Visit: Payer: Self-pay | Admitting: Family Medicine

## 2010-12-25 ENCOUNTER — Other Ambulatory Visit: Payer: Self-pay | Admitting: Family Medicine

## 2011-01-21 ENCOUNTER — Other Ambulatory Visit: Payer: Self-pay | Admitting: Specialist

## 2011-01-21 DIAGNOSIS — M542 Cervicalgia: Secondary | ICD-10-CM

## 2011-01-22 NOTE — Op Note (Signed)
NAMEGEDALYA, JIM            ACCOUNT NO.:  [id number]   MEDICAL RECORD NO.:  [id number]          PATIENT TYPE:  OIB   LOCATION:  3012                         FACILITY:  [facility]   PHYSICIAN:  Kerrin Champagne, M.D.   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   PROCEDURE:  Anterior cervical diskectomy and fusion C4-C5 and C3-C4 with  transgraft 7-mm at the C3-C4 level and 8-mm at the C4-C5 level with  local bone graft.  Internal fixation with a 34-mm Alphatec TRESTLE plate  and 36-UY screws.  Operating room microscope used during this procedure.   SURGEON:  Kerrin Champagne, MD   ASSISTANT:  Wende Neighbors, [facility].   ANESTHESIA:  General via orotracheal intubation by Dr. Noreene Larsson.   FINDINGS:  HNP central right sided C3-C4, central HNP C4-C5.   ESTIMATED BLOOD LOSS:  75 mL.   COMPLICATIONS:  None.   DRAINS:  Foley to straight drain, 10-French TLS drain to red top tube  over the anterior neck.  The patient returned to the PACU in good  condition.   HISTORY OF PRESENT ILLNESS:  The patient is a 55 year old male who began  experiencing pain into his neck radiation into his shoulders then into  his right arm greater than left with numbness and paresthesias.  The  patient has had chronic history of back and neck pain, and then  significant worsening discomfort and pain over time.  He has had  previous history of posterior cervical spine surgery in the [place]  area.  He relates he is  experiencing numbness and paresthesias into the  right arm greater than the left that caused him to have difficulty with  sleeping, difficulty with overhead using of his arms, and overhead  lifting.  The patient underwent evaluation in June 2009.  He was found  to have significant clinical findings consistent with weakness and his  shoulder abductors on the right side paresthesias present, weakness in  intrinsic hand function.  MRI scan demonstrated a large paracentral disk  herniation posterolateral with cranial extension, caudal extension to  the right side causing severe central canal stenosis.  The patient had  moderate to severe right foraminal stenosis.  Left neural foramen was  patent.  C4-C5 had broad central disk protrusion with severe central  stenosis to the disk osteophyte complex and protrusion.  The C5-C6 level  felt to have mild changes of disk protrusion without significant  stenosis here.  The patient is brought to the operating room to undergo  anterior diskectomy and fusion of both C3-C4 and C4-C5.  EMGs, nerve  conduction studies have demonstrated findings consistent with cervical  radiculopathy in the C5 and C6  distribution.  He was brought to the  operating room to undergo anterior diskectomy and fusion of both C3-C4  and C4-C5 with internal fixation plates and screws.   INTRAOPERATIVE FINDINGS:  As above.   DESCRIPTION OF PROCEDURE:  After adequate general anesthesia, the  patient in a supine position and the neck in very slight extension with  5-pound cervical halter traction present.  No attempts to extend the  neck greater than just a slight degree.  Beach-chair position was used  with the arms at the sides, well padded.  A Foley catheter was placed.  Standard preoperative antibiotics of vancomycin were used.  Prepped with  DuraPrep solution over the anterior aspect of the neck extending from  the chin to the upper chest area.  Draped in the usual manner, iodine  Vi-  Drape was used.  The incision at the mid thyroid cartilage level  extending from across the midline to the left side.  Through the skin  and subcutaneous layers down to the platysmal layer, this was incised in  line with the skin incision using Bovie electrocautery.  The fascial  layers overlying the sternocleidomastoid were then carefully freed up  and allowing for immobilization of the incision.  Interval between the  trachea, esophagus, and the carotid sheath was then bluntly dissected  using finger dissection to the anterior aspect of cervical spine.  The  omohyoid muscle identified and carefully freed up, isolated, then  divided using electrocautery further mobilizing this tracheal esophagus  medially.  Small vessels crossing at the level of the omohyoid  representing some venous blood supply were then carefully suture ligated  using a 2-0 Vicryl stitch and then divided.  The esophagus was then  carefully mobilized to the right side.  Spinal needle was placed through  the expected C5-C6 and C4-C5 levels.  Intraoperative study demonstrating  this to be the case.  We slowly continued dissection and exposure was  continued upwards to the expected C4-C5 level and C3-C4 levels.  These  levels were then marked further with a spinal needle.  Intraoperative  lateral radiograph obtained demonstrated the needles within the disk  place C3-C4 and C4-C5.  Using hand-held Clowards then the medial border  of the longus colli muscle was carefully freed up at both the C3-C4  level and C4-C5 level exposing the anterior aspect of the cervical spine  here.  A Boss McCullough retractor was placed into the incision for the  blade beneath the border of the longus colli muscle on both sides  obtaining retraction and exposure of the C4-C5 level first.  A 14-mm  screw plate first then placed at the C4 level and additional one  parallel to the first at the C5 level and distraction disk space  obtained.   Note, with the spinal needles shown being in the correct  position and alignment;  each of these were removed under direct  observation and a small portion of the disk excised using a 15-blade  scalpel and pituitary rongeurs at both the C3-C4 and C4-C5 levels.  When  this completed then distraction across C4-C5 level was noted, 4-mm  Kerrison was used to resect small amount of the anterior lip osteophyte.  The disk space was debrided of  degenerative disk disease using  pituitary rongeurs as well as spinal curettes and microcurettes.  Operating room microscopes was then draped and brought into the field  and under direct observation and anterior lip osteophytes were resected  further using 2-3 mm  Kerrison.  Bone graft preserved for later bone  grafting purposes.  Under the operating room microscope, the disc space  was debrided to the posterior aspect of the disk using pituitary rongeur  as well as curettage.  The posterior annulus was resected and disc  material noted extending centrally causing disk herniation here.  Posterior longitudinal ligament was then resected using 1 and 2-mm  Kerrison to the right and left side and spinal canal well decompressed  by excising posterior lip osteophytes and then the anterior lip  osteophytes using 1-2-mm Kerrison.  This bone material was preserved and  used for later bone grafting purposes.  High-speed bur was then used  carefully, removed a cartilaginous surface from the ends of the inferior  aspect of C4 and superior aspect of C5 down to bleeding bone surfaces.  Thrombin-soaked Gelfoam used to obtain hemostasis.  The height of the  intervertebral disk space then measured using 7 mm and 8 mm sounders.  The 8 mm provided the best fit.  An 8-mm transgraft was chosen, this was  then carefully packed with morselized local bone graft material  centrally.  Intervertebral disk space then carefully irrigated with  copious amounts of irrigating solution.   The height of the  intervertebral disk space as measured at 8-mm depth measured to 18-mm  using glenoid depth gauge, 11-mm depth of the transgraft noted.  Graft  packed was then placed over the anterior aspect of the intervertebral  disk space and then packed it into place.  Care was taken to assure  there was no soft tissue remaining within the disk space, it could be  impacted or retropulsed with insertion of the graft.  Once the graft has  been inserted, the post at the C5 level was then removed and bone wax  applied to the bleeding opening in the bone.  Hemostasis was obtained  this way.  The screw-post at the C4 level was removed in order to allow  for retraction of the soft tissue superiorly through the C3-C4 level.  Our incision unfortunately was slightly low such that we had to retract  superiorly carefully exposing the C3-C4 level, elevating the medial  borders of the longus colli muscle.  A 14-mm screw-post was then placed  into the anterior aspect of the vertebral body of C3.  The 14-mm screw-  post was then replaced into the anterior aspect of vertebral body in C4  and distraction obtained between these 2 screw-posts.  The [facility]  retractor was left in at the C4 level for continued  distraction in this area without over retraction of the upper aspect of  the esophagus.  A 15 blade scalpel used to incise the disk at the C3-4  level after distraction of the disk.  Pituitary rongeurs as well as  curettage carried out of the disk space removing degenerative disk  disease and the cartilaginous endplates over the inferior aspect of C3,  superior aspect of C4.  This was removed all the way back to the  posterior aspect of the disk space and posterior lip osteophytes present  here.  Operating room microscope was carefully used during this portion  of the procedure and identified disk protrusion extending central and  rightward and disk herniation was removed using the  micropituitary  rongeurs.  This did decompress the right side of the cervical canal.  Posterior lip osteophytes were resected using 1-mm Kerrison on both  sides.  Foraminotomy performed over the right C4 nerve  root.  Large  amount of disk material noted extending to the right side C4 neural  foramen.  There was resection of osteophytes here, further disk material  was removed using micropituitary rongeurs and titanium nerve hook.  Posterior longitudinal ligament was resected and further disk material  was removed at this time.  Spinal canal was judged to be well  decompressed.  The height of the intervertebral disk space was then  measured as 7-mm using sounders  provided.  The depth measured at 18 mm  again.  High-speed bur was then used carefully over the endplates to  bleeding bone surfaces.  Irrigation performed and thrombin-soaked  Gelfoam to obtain hemostasis.  A 7-mm transgraft packed centrally with  bone graft was harvested for anterior and posterior lip osteophytes of  C3-C4 and was then carefully placed into the anterior aspect of the disk  space and packed into place carefully subset at this level an additional  2-3 mm.  __________ the bone grafts had been placed to both C3-C4 and C4-  C5.  Screw-posts were removed from the vertebral body of C3 and C4 and  bone wax applied to the remaining screw-post holes.  Carefully  retracting the esophagus and soft tissues, then high-speed bur was then  used carefully smooth the anterior aspect of the C3-C4, C4-C5 levels for  the plate to carefully anneal  these segments.  Small bleeders  controlled using electrocautery.  A length of the plate measured using  bone wax applied to a cottonoid string at about 34-mm.  A 34-mm plate  then placed across the anterior aspect of the cervical spine and  carefully outlined and a single retaining pin placed on the left side of  the C5 level.  Two anterior screws were then first placed using 14-mm   drill with positive stop and 14-mm screws were replaced capturing the  plate quite nicely.  The right side screw was then placed at the C5  level obtaining some DCP-like movement of the plate from C4 to C5 by  placing the screw slightly eccentric and inferior.  Drilling with a 14-  mm drill bit, then placed a 14-mm screw on the right side.  A  temporary  retaining pin was then removed, and on the left side then a  used to  drill hole for the 14-mm screw on the left side of C5.  A 14-mm screw  was then placed on this side without difficulty obtaining an excellent  purchase.  Then at the C5 level, drill were then used to perform drill  holes on the left side.  A 14-mm screw was placed on the right side  drilling and placing a 14-mm screw.  Note that the longitudinal traction  on this patient's neck was released prior to the placement of screws at  the C4 level.  With this then, intraoperative lateral radiograph of the  cervical spine demonstrates plates and screws in good position and  alignment fixing bone graft at the C3-C4 levels and C4-C5 level.  Irrigation was then carried out.  Careful inspection of this  demonstrated no abnormalities.  Single 10-French TLS drain was then  placed into the depth of the incision extending over the lower incision  in the area.  This was held in place with a 4-0 nylon stitch.  Platysmal  layer was then reapproximated with interrupted 3-0 Vicryl sutures,  subcuticular layers with interrupted 3-0 and then 4-0 subcuticular  stitch was placed.  Dermabond was applied.  The patient then placed into  a [place] collar following application of 4 x 4 fixed to the skin  with  Hypafix tape.  TLS drain was was charged.  The patient was then  extubated, returned to the recovery room in a satisfactory condition.  All instrument and sponge counts were correct.     Kerrin Champagne, M.D.  Electronically Signed    JEN/MEDQ  D:  03/31/2008  T:  04/01/2008  Job:   [id number]

## 2011-01-23 ENCOUNTER — Ambulatory Visit
Admission: RE | Admit: 2011-01-23 | Discharge: 2011-01-23 | Disposition: A | Discharge status: Home / Self Care | Payer: PRIVATE HEALTH INSURANCE | Source: Ambulatory Visit | Attending: Specialist | Admitting: Specialist

## 2011-01-23 DIAGNOSIS — M542 Cervicalgia: Secondary | ICD-10-CM

## 2011-01-25 NOTE — Assessment & Plan Note (Signed)
[facility]                           [place] OFFICE NOTE   Alejandro, Dixon                     MRN:          [id number]  DATE:08/06/2006                            DOB:          January 05, 1956    Mr. Alejandro Dixon is a 55 year old black male who comes to establish with  practice due to chills, aching, head congestion, and mild diarrhea for  approximately 3 days.  He is not sure if he has had fever but has been  hot.  He has been taking Coricidin with little improvement.  He has had  no work since August 03, 2006.   He previously has seen Dr. Barnet Dixon in [place], [place].   CURRENT MEDICATIONS:  1. Verapamil 360 one daily.  2. Effexor 37.5 daily.  3. Atenolol 25 mg b.i.d.  4. Lipitor, dose not known.   ALLERGIES:  PENICILLIN causes swelling.   PAST MEDICAL HISTORY:  Significant for:  1. Hypertension.  2. Depression.  3. Elevated cholesterol.  4. He indicates that he has had bone spurs on his right shoulder      removed in 2005 and has been hospitalized for that surgery only.   SOCIAL HISTORY:  He is married and has a 26 year old daughter and 95-  year-old son.  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  His family moved to the area 6-7 months ago.  He is in no  exercise program.  Has never smoked, uses alcohol weekends, and does not  use street drugs.   REVIEW OF SYSTEMS:  HEENT, cardiovascular, respiratory, GI, GU,  musculoskeletal, and allergies are all negative.  He does wear glasses,  and they are bifocal.  Last eye exam was June 2007 at which time he did  not need a new prescription and was negative for glaucoma and cataracts.  He has a tattoo on his right upper and left upper arm.  He has had no  blood transfusions.   FAMILY HISTORY:  He has no information regarding his father or his  father's side of the family.  His mother died at age 51, having had  stomach cancer. He has one sister  living.  He has no information about  her.  He has no knowledge of his extended family.   IMMUNIZATIONS:  His last tetanus was greater than 10 years ago.  He has  not had hepatitis B or pneumonia vaccine.   PHYSICAL EXAMINATION:  VITAL SIGNS:  Blood pressure 107/70, temperature  98.0, pulse 64.  Weight 173, height 5 feet 6-1/2 inches.  GENERAL: Well-developed, well-nourished black male in no acute distress.  HEENT:  TMs are retracted bilaterally with fluid present.  Nasal mucosa  is erythematous and edematous.  Posterior pharynx is injected, no  exudate. Sinuses are not tender.  NECK: Without lymphadenopathy.  CHEST: Clear throughout.  He does have a bit of a moist cough.  HEART: Regular rate and rhythm without murmurs, gallops, or rubs.  No  carotid bruits.  ABDOMEN: Soft and flat without masses or tenderness.  No organomegaly.  Bowel sounds are present but not  hyperactive.  He has no rebound or  referred rebound.  MUSCULOSKELETAL:  Equal muscle mass upper and lower extremities.  Gait  and station within normal limits.  SKIN: Without obvious lesion in the exposed area.  PSYCHIATRIC:  Oriented x3.  Verbalizes easily and is quite pleasant.   ASSESSMENT:  Viral syndrome.  We will start him on some Clarinex 5 mg 1  daily (samples and prescription have been given).  He will do clear  liquids and a BRAT diet for the next 24-36 hours.  Tylenol p.r.n. and  over-the-counter cold preparations if needed.   We will see him back in 5-7 days if not improved.  Work note has been  given.   HEALTH MAINTENANCE:  He is in need of a DT and will need to begin to  have fasting lab work in the very near future to establish baseline.  He  understands all of the above.      Alejandro D. Bean, FNP  Electronically Signed      Arta Silence, MD  Electronically Signed   BDB/MedQ  DD: 08/20/2006  DT: 08/20/2006  Job #: [id number]

## 2011-02-22 ENCOUNTER — Encounter (HOSPITAL_COMMUNITY)
Admission: RE | Admit: 2011-02-22 | Discharge: 2011-02-22 | Disposition: A | Discharge status: Home / Self Care | Payer: PRIVATE HEALTH INSURANCE | Source: Ambulatory Visit | Attending: Specialist | Admitting: Specialist

## 2011-02-22 ENCOUNTER — Ambulatory Visit (HOSPITAL_COMMUNITY)
Admission: RE | Admit: 2011-02-22 | Discharge: 2011-02-22 | Disposition: A | Discharge status: Home / Self Care | Payer: PRIVATE HEALTH INSURANCE | Source: Ambulatory Visit | Attending: Specialist | Admitting: Specialist

## 2011-02-22 ENCOUNTER — Other Ambulatory Visit (HOSPITAL_COMMUNITY): Payer: Self-pay | Admitting: Specialist

## 2011-02-22 DIAGNOSIS — Z01812 Encounter for preprocedural laboratory examination: Secondary | ICD-10-CM | POA: Insufficient documentation

## 2011-02-22 DIAGNOSIS — Z01810 Encounter for preprocedural cardiovascular examination: Secondary | ICD-10-CM | POA: Insufficient documentation

## 2011-02-22 DIAGNOSIS — M5020 Other cervical disc displacement, unspecified cervical region: Secondary | ICD-10-CM

## 2011-02-22 DIAGNOSIS — Z01818 Encounter for other preprocedural examination: Secondary | ICD-10-CM | POA: Insufficient documentation

## 2011-02-22 LAB — DIFFERENTIAL
Basophils Relative: 1 % (ref 0–1)
Eosinophils Absolute: 0.3 10*3/uL (ref 0.0–0.7)
Lymphs Abs: 2 10*3/uL (ref 0.7–4.0)
Monocytes Absolute: 0.4 10*3/uL (ref 0.1–1.0)
Monocytes Relative: 8 % (ref 3–12)
Neutrophils Relative %: 47 % (ref 43–77)

## 2011-02-22 LAB — CBC
Hemoglobin: 15.3 g/dL (ref 13.0–17.0)
MCH: 29.4 pg (ref 26.0–34.0)
MCHC: 34.4 g/dL (ref 30.0–36.0)
MCV: 85.6 fL (ref 78.0–100.0)
Platelets: 267 10*3/uL (ref 150–400)
RBC: 5.2 MIL/uL (ref 4.22–5.81)

## 2011-02-22 LAB — BASIC METABOLIC PANEL
BUN: 13 mg/dL (ref 6–23)
CO2: 33 mEq/L — ABNORMAL HIGH (ref 19–32)
Calcium: 9.5 mg/dL (ref 8.4–10.5)
Chloride: 101 mEq/L (ref 96–112)
Creatinine, Ser: 1.13 mg/dL (ref 0.50–1.35)
GFR calc Af Amer: >60 mL/min (ref >60)
GFR calc non Af Amer: >60 mL/min (ref >60)
Sodium: 140 mEq/L (ref 135–145)

## 2011-02-22 LAB — URINALYSIS, ROUTINE W REFLEX MICROSCOPIC
Glucose, UA: NEGATIVE mg/dL
Hgb urine dipstick: NEGATIVE
Leukocytes, UA: NEGATIVE
pH: 5.5 (ref 5.0–8.0)

## 2011-02-26 ENCOUNTER — Ambulatory Visit (HOSPITAL_COMMUNITY)
Admission: RE | Admit: 2011-02-26 | Discharge: 2011-02-27 | Disposition: A | Discharge status: Home / Self Care | Payer: PRIVATE HEALTH INSURANCE | Source: Ambulatory Visit | Attending: Specialist | Admitting: Specialist

## 2011-02-26 ENCOUNTER — Ambulatory Visit (HOSPITAL_COMMUNITY): Payer: PRIVATE HEALTH INSURANCE

## 2011-02-26 DIAGNOSIS — Z981 Arthrodesis status: Secondary | ICD-10-CM | POA: Insufficient documentation

## 2011-02-26 DIAGNOSIS — Y9210 Unspecified residential institution as the place of occurrence of the external cause: Secondary | ICD-10-CM | POA: Insufficient documentation

## 2011-02-26 DIAGNOSIS — Z01818 Encounter for other preprocedural examination: Secondary | ICD-10-CM | POA: Insufficient documentation

## 2011-02-26 DIAGNOSIS — IMO0002 Reserved for concepts with insufficient information to code with codable children: Secondary | ICD-10-CM | POA: Insufficient documentation

## 2011-02-26 DIAGNOSIS — Z01812 Encounter for preprocedural laboratory examination: Secondary | ICD-10-CM | POA: Insufficient documentation

## 2011-02-26 DIAGNOSIS — M5020 Other cervical disc displacement, unspecified cervical region: Secondary | ICD-10-CM | POA: Insufficient documentation

## 2011-02-26 DIAGNOSIS — I10 Essential (primary) hypertension: Secondary | ICD-10-CM | POA: Insufficient documentation

## 2011-02-26 DIAGNOSIS — Z01810 Encounter for preprocedural cardiovascular examination: Secondary | ICD-10-CM | POA: Insufficient documentation

## 2011-03-14 ENCOUNTER — Other Ambulatory Visit: Payer: Self-pay | Admitting: *Deleted

## 2011-03-14 NOTE — Telephone Encounter (Signed)
Has not been seen in a long time.   F/u 30 min CPX  Ok to send in Ambien 10 mg, 1 po prn sleep, 0 refills No more refills until cpx

## 2011-03-15 MED ORDER — ZOLPIDEM TARTRATE 10 MG PO TABS: 10.0000 mg | ORAL_TABLET | Freq: Every evening | ORAL | Status: AC | PRN

## 2011-03-15 NOTE — Telephone Encounter (Signed)
Pt requesting status of Rx. Rx phoned in per VO/note by MD.

## 2011-03-15 NOTE — Telephone Encounter (Signed)
i can't tell if this was done, so i will reforward. Sorry if there is confusion and you have already completed.

## 2011-03-19 NOTE — Op Note (Signed)
NAMEQUAMEL, Alejandro Dixon            ACCOUNT NO.:  [id number]  MEDICAL RECORD NO.:  [id number]  LOCATION:  5011                         FACILITY:  [facility]  PHYSICIAN:  Kerrin Champagne, M.D.   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  The patient also had a laceration of the oropharyngeal area with intubation.  PROCEDURE:  Right C6-7 [place] foraminotomy with excision of herniated nucleus pulposus, operating microscope used during this procedure.  SURGEON:  Kerrin Champagne, MD  ASSISTANT:  Wende Neighbors, PA-C  ANESTHESIA:  General via orotracheal intubation, Dr. Sheldon Silvan supplemented with local infiltration with Marcaine 0.5% with 1:200,000 epinephrine 7 mL.  FINDINGS:  Large disk herniation, right side C6-7, neural foramen affecting the right C7 nerve root, seven fragments excised.  ESTIMATED BLOOD LOSS:  Less than 50 mL.  The patient returned to the PACU in good condition.  HISTORY OF PRESENT ILLNESS:  This patient is a right-handed 55 year old male.  He has had history of previous cervical spine surgery, cervical fusion at the C3-4 and C4-5 areas.  He has had posterior surgery as well in the past.  The patient presents with a 32-month history of neck pain radiation into his right thumb, index, long finger with weakness of the right wrist volar flexion and triceps strength weakness.  He underwent attempts at conservative management including steroid Dosepak, cervical traction, McKenzie exercises without relief of pain or improvement in overall  strength.  After course of attempts at relieving his discomfort, the patient elected to undergo right-sided cervical foraminotomy with diskectomy.  MRI scan shows a large disk herniation, right side C6-7 focal into the neural foramen affecting the right C7 nerve root.  The patient has had discussion regarding risks and benefits of procedure, signed informed consent.  DESCRIPTION OF PROCEDURE:  This patient was seen in the preoperative holding area.  He had marking of the right side of his neck at the expected C6-7 level with X and my initials posteriorly.  The patient received preoperative antibiotics and vancomycin for PENICILLIN allergy. He has signed informed consent.  Discussion preoperatively answered all questions.  The patient was transported to the OR room #10 [facility] via stretcher.  There, he underwent intubation, Dr. Ivin Booty performing intubation.  Unfortunately, he sustained a small laceration of the oropharynx with the intubation.  The patient then proceeded without difficulty.  Discussion with Dr. Ivin Booty has felt that this was an oral pharyngeal laceration, not near the vocal cords over the trachea, therefore, should heal uneventfully.  The patient following intubation was then transferred to the OR table in prone position, chest rolls.  A [place]field horseshoe was used to support the patient's head.  Pressure off of the orbits noted.  He had a PAS hose, arms at the sides well padded. All pressure points were well padded.  Neck in slight flexion. Shoulders were taped and the skin over the cervical dorsal junction was taped distally to provide skin traction.  This evened out any skin creases in this neck.  Standard prep with DuraPrep solution.  He was draped in the usual manner.  Iodine Vi-Drape was used.  C-arm fluoro was used for identification of the level.  The patient had standard time-out protocol carried out identifying the patient, procedure, the side  of procedure, expected outcome, length of time, estimated blood loss. Infiltration of the skin at the expected C6-7 level in the midline incision made ellipsing a portion of the old incision scar at the upper aspect of the incision site.  Incision about a inch and half to 2 inches length.  Through the skin and subcu layers down to the cervical spinous processes, C7-T1 expected.  Electrocautery used to incise the ligamentum nuchae, and the paracervical muscle attachments off the right side of the spinous process at C7-T1.  Clamp placed over the posterior aspect of spinous process of C7.  Intraoperative lateral C-arm fluoro demonstrated the clamp at the C7 level.  The calves were then used to elevate the paracervical muscles on the right side at C6 and C7 after marking the C7 level with a 0 Vicryl suture on UR6 needle using electrocautery.  The muscle attachments at the inferior aspect lamina at C6 were released, right side.  Bipolar electrocautery and monopolar electrocautery used to control bleeders.  Boss McCullough retractor was then inserted.  Excellent exposure on the right side of C6-7 was obtained using loupe magnification and headlamp.  The soft tissue overlying the medial aspect of C6-7 facet was debrided using Matt Holmes rongeur.  High-speed bur was then used to remove small portion of the inferior and medial aspect of the inferior articular process of C6 and a small portion of the superior aspect of lamina of C7, and the medial aspect of C7 superior to the process.  This was thinned, and 1-mm Kerrison used to free the ligamentum flavum attachment to the superior aspect of the lamina of C7 removing bone here along the inferior medial margin of the inferior articular process of C6.  Ligamentum flavum was then detached from medial aspect of the superior articular process of C7 2-mm Kerrison, 1-mm Kerrisons.  Retracted with nerve hook then resected using 2-3 mm Kerrisons.  Operating room  microscope was sterilely draped, brought into the field on the operating room microscope then small portion of the medial aspect of the facet at C6-7 was then resected using 2-mm Kerrisons.  This was resected laterally and total nerve root was noted to be free, and the nerve hook could be passed about the lateral aspect of the C7 pedicle indicating adequate foraminotomy had been performed.  A small portion of the superior medial border of the C7 pedicle was resected using a forward angle 3-0 microcurette.  Small bleeders in the epidural veins vascular leash over the axillary area of the C7 nerve root carefully cauterize retracting the nerve root superiorly with a nerve hook.  Nerve was then retracted superiorly, and the disk explored ventral to the nerve root.  Found to be ruptured.  Ab 11 blade scalpel was used to incise the disk vertically and using then pressure ventrally with the nerve hook, disk material was noted to extrude down through the incision into the posterior longitudinal ligament.  Debridement was then carried out of the disk material, was able to be removed through this opening, posterior longitudinal ligament as  many as seven fragments of disk material were able to be removed palpating superiorly beneath the C7 nerve root and removing at least seven extruded disk fragments.  When this was completed, then the nerve root was noted to be free, then the neural foramen exiting without any further nerve compression.  Irrigation was carried out.  Small epidural veins were cauterized using bipolar electrocautery and carefully retracting the C7 nerve root with this.  Following further irrigation, bone wax was applied to bleeding cancellous bone surfaces.  He has no active bleeding present at the base of the wounds.  The retractors were then removed.  Ligamentum nuchae was then reapproximated at the midline with interrupted 0 Vicryl sutures using UR6 needle.  Deep subcu  layers approximated with interrupted 0 Vicryl sutures.  The superficial layers with interrupted 2-0 Vicryl sutures, and skin closed with a running subcuticular stitch of 4-0 Vicryl.  Dermabond was then applied and Mepilex bandages, soft cervical collar.  The patient was then able to be returned to a supine position, reactivated, extubated, and returned to the recovery room in satisfactory condition.  All instrument and sponge counts were correct.  PHYSICIAN ASSISTANT'S RESPONSIBILITIES:  Maud Deed performed the duties of assistant surgeon during this procedure.  She was present from the very beginning of the case until the end of the case assisting the patient's transfer to and from the operating room table, assisted in positioning the patient's head an waist and shoulders, assisted with the removal of hair using clippers over the back of the neck and of her thoracic area, and then assisted in positioning arms and legs.  During the case, she performed duties of assistant surgeon performing delicate suctioning near the neural elements of the spinal cord and C7 nerve root along the right side using loupe magnification as well as the operating room microscope.  She used suctioning to assist in removal of several disk fragments and in the careful delicate retraction of the C7 nerve root using nerve hook.  At the end of the case, we performed closure from the patient's ligamentum nuchae level to the skin and application of dressings and the soft cervical collar.  She assisted then in transferring the patient from the OR table to his bed and stretcher.     Kerrin Champagne, M.D.     JEN/MEDQ  D:  02/26/2011  T:  02/27/2011  Job:  [id number]  Electronically Signed by Vira Browns M.D. on 03/19/2011 11:10:09 AM

## 2011-03-22 ENCOUNTER — Other Ambulatory Visit: Payer: Self-pay | Admitting: Family Medicine

## 2011-03-22 NOTE — Telephone Encounter (Signed)
Patient has appt in aug for labs and physical

## 2011-04-05 ENCOUNTER — Other Ambulatory Visit: Payer: Self-pay | Admitting: Family Medicine

## 2011-04-11 ENCOUNTER — Other Ambulatory Visit (INDEPENDENT_AMBULATORY_CARE_PROVIDER_SITE_OTHER): Payer: PRIVATE HEALTH INSURANCE | Admitting: Family Medicine

## 2011-04-11 DIAGNOSIS — Z125 Encounter for screening for malignant neoplasm of prostate: Secondary | ICD-10-CM

## 2011-04-11 DIAGNOSIS — E785 Hyperlipidemia, unspecified: Secondary | ICD-10-CM

## 2011-04-11 DIAGNOSIS — Z79899 Other long term (current) drug therapy: Secondary | ICD-10-CM

## 2011-04-11 LAB — BASIC METABOLIC PANEL
BUN: 13 mg/dL (ref 6–23)
Calcium: 9.4 mg/dL (ref 8.4–10.5)
Creatinine, Ser: 1.2 mg/dL (ref 0.4–1.5)
GFR: 79.99 mL/min (ref >60.00)
Potassium: 4.7 mEq/L (ref 3.5–5.1)

## 2011-04-11 LAB — CBC WITH DIFFERENTIAL/PLATELET
Eosinophils Relative: 3.5 % (ref 0.0–5.0)
HCT: 43 % (ref 39.0–52.0)
Hemoglobin: 14 g/dL (ref 13.0–17.0)
Lymphocytes Relative: 29.5 % (ref 12.0–46.0)
Lymphs Abs: 1.6 10*3/uL (ref 0.7–4.0)
Monocytes Relative: 8.3 % (ref 3.0–12.0)
Neutro Abs: 3.1 10*3/uL (ref 1.4–7.7)
RBC: 4.87 Mil/uL (ref 4.22–5.81)
WBC: 5.4 10*3/uL (ref 4.5–10.5)

## 2011-04-11 LAB — LIPID PANEL
Total CHOL/HDL Ratio: 4
VLDL: 9.6 mg/dL (ref 0.0–40.0)

## 2011-04-11 LAB — HEPATIC FUNCTION PANEL
ALT: 12 U/L (ref 0–53)
AST: 19 U/L (ref 0–37)
Bilirubin, Direct: 0.1 mg/dL (ref 0.0–0.3)
Total Protein: 6.6 g/dL (ref 6.0–8.3)

## 2011-04-11 LAB — PSA: PSA: 0.37 ng/mL (ref 0.10–4.00)

## 2011-04-16 ENCOUNTER — Encounter: Payer: Self-pay | Admitting: Family Medicine

## 2011-04-18 ENCOUNTER — Ambulatory Visit (INDEPENDENT_AMBULATORY_CARE_PROVIDER_SITE_OTHER): Payer: PRIVATE HEALTH INSURANCE | Admitting: Family Medicine

## 2011-04-18 ENCOUNTER — Encounter: Payer: Self-pay | Admitting: Family Medicine

## 2011-04-18 VITALS — BP 120/76 | HR 80 | Temp 98.7°F | Ht 67.0 in | Wt 174.8 lb

## 2011-04-18 DIAGNOSIS — E785 Hyperlipidemia, unspecified: Secondary | ICD-10-CM

## 2011-04-18 DIAGNOSIS — I10 Essential (primary) hypertension: Secondary | ICD-10-CM

## 2011-04-18 DIAGNOSIS — Z1211 Encounter for screening for malignant neoplasm of colon: Secondary | ICD-10-CM

## 2011-04-18 DIAGNOSIS — R6882 Decreased libido: Secondary | ICD-10-CM

## 2011-04-18 DIAGNOSIS — Z0000 Encounter for general adult medical examination without abnormal findings: Secondary | ICD-10-CM

## 2011-04-18 DIAGNOSIS — F528 Other sexual dysfunction not due to a substance or known physiological condition: Secondary | ICD-10-CM

## 2011-04-18 MED ORDER — HYDROCHLOROTHIAZIDE 12.5 MG PO TABS: 12.5000 mg | ORAL_TABLET | Freq: Every day | ORAL | Status: DC

## 2011-04-18 MED ORDER — TADALAFIL 5 MG PO TABS: ORAL_TABLET | ORAL | Status: DC

## 2011-04-18 NOTE — Progress Notes (Signed)
Subjective:    Patient ID: Alejandro Dixon, male    DOB: 1956/02/22, 55 y.o.   MRN: [id number]  HPI  Alejandro Dixon, a 55 y.o. male presents today in the office for the following:    CPX  Preventative Health Maintenance Visit:  Health Maintenance Summary Reviewed and updated, unless pt declines services.  Tobacco History Reviewed. Alcohol: No concerns, no excessive use Exercise Habits: Some activity, rec at least 30 mins 5 times a week STD concerns: no risk or activity to increase risk Drug Use: None Encouraged self-testicular check  Health Maintenance  Topic Date Due  . Colonoscopy  07/11/2008  . Influenza Vaccine  06/10/2011  . Tetanus/tdap  09/16/2019    Labs reviewed with the patient.   Lipids:    Component Value Date/Time   CHOL 217* 04/11/2011 0825   TRIG 48.0 04/11/2011 0825   HDL 60.30 04/11/2011 0825   LDLDIRECT 139.6 04/11/2011 0825   VLDL 9.6 04/11/2011 0825   CHOLHDL 4 04/11/2011 0825    CBC:    Component Value Date/Time   WBC 5.4 04/11/2011 0825   HGB 14.0 04/11/2011 0825   HCT 43.0 04/11/2011 0825   PLT 302.0 04/11/2011 0825   MCV 88.4 04/11/2011 0825   NEUTROABS 3.1 04/11/2011 0825   LYMPHSABS 1.6 04/11/2011 0825   MONOABS 0.4 04/11/2011 0825   EOSABS 0.2 04/11/2011 0825   BASOSABS 0.0 04/11/2011 0825    Basic Metabolic Panel:    Component Value Date/Time   NA 141 04/11/2011 0825   K 4.7 04/11/2011 0825   CL 101 04/11/2011 0825   CO2 31 04/11/2011 0825   BUN 13 04/11/2011 0825   CREATININE 1.2 04/11/2011 0825   GLUCOSE 100* 04/11/2011 0825   CALCIUM 9.4 04/11/2011 0825    Lab Results  Component Value Date   ALT 12 04/11/2011   AST 19 04/11/2011   ALKPHOS 66 04/11/2011   BILITOT 0.5 04/11/2011    Lab Results  Component Value Date   PSA 0.37 04/11/2011   PSA 0.31 01/29/2007   PSA 0.7 04/10/2003     Colon - [facility]  Knot on his forehead. Noticed a couple of months ago. Never really paid attention before. No trauma or running into anything.   ED, continued probs. Could not  afford Viagra  Lab Results  Component Value Date   TESTOSTERONE 353.57 09/15/2009   HTN: Tolerating all medications - having some ED.  Stable and at goal No CP, no sob. No HA.  BP Readings from Last 3 Encounters:  04/18/11 120/76  09/28/09 106/72  09/15/09 136/94   Lipids: Doing well, stable. Stopped meds Panel reviewed with patient.  Lipids:    Component Value Date/Time   CHOL 217* 04/11/2011 0825   TRIG 48.0 04/11/2011 0825   HDL 60.30 04/11/2011 0825   LDLDIRECT 139.6 04/11/2011 0825   VLDL 9.6 04/11/2011 0825   CHOLHDL 4 04/11/2011 0825    Lab Results  Component Value Date   ALT 12 04/11/2011   AST 19 04/11/2011   ALKPHOS 66 04/11/2011   BILITOT 0.5 04/11/2011   Patient Active Problem List  Diagnoses  . HYPERLIPIDEMIA  . ERECTILE DYSFUNCTION  . HYPERTENSION  . INSOMNIA  . DECREASED LIBIDO   Past Medical History  Diagnosis Date  . Anxiety   . Depression   . Hyperlipidemia   . Hypertension    Past Surgical History  Procedure Date  . Shoulder     right bone spurs 2005  .  Anterior cervical decomp/discectomy fusion 04-2008    c3-4 c4-5    History  Substance Use Topics  . Smoking status: Never Smoker   . Smokeless tobacco: Not on file  . Alcohol Use: Not on file   Family History  Problem Relation Age of Onset  . Cancer Mother     stomach    Allergies  Allergen Reactions  . Penicillins     REACTION: SWELLING   Current Outpatient Prescriptions on File Prior to Visit  Medication Sig Dispense Refill  . zolpidem (AMBIEN) 10 MG tablet Take 10 mg by mouth at bedtime as needed.          Review of Systems  General: Denies fever, chills, sweats. No significant weight loss. Eyes: Denies blurring,significant itching ENT: Denies earache, sore throat, and hoarseness. Cardiovascular: Denies chest pains, palpitations, dyspnea on exertion Respiratory: Denies cough, dyspnea at rest,wheeezing Breast: no concerns about lumps GI: Denies nausea, vomiting, diarrhea,  constipation, change in bowel habits, abdominal pain, melena, hematochezia GU: ABOVE Musculoskeletal: recent surgery by Dr. Moise Boring: ABOVE Neuro: Denies  paresthesias, frequent falls, frequent headaches Psych: Denies depression, anxiety Endocrine: Denies cold intolerance, heat intolerance, polydipsia Heme: Denies enlarged lymph nodes Allergy: No hayfever     Objective:   Physical Exam   Physical Exam  Blood pressure 120/76, pulse 80, temperature 98.7 F (37.1 C), height 5\' 7"  (1.702 m), weight 174 lb 12 oz (79.266 kg).  PE: GEN: well developed, well nourished, no acute distress Eyes: conjunctiva and lids normal, PERRLA, EOMI ENT: TM clear, nares clear, oral exam WNL Neck: supple, no lymphadenopathy, no thyromegaly, no JVD Pulm: clear to auscultation and percussion, respiratory effort normal CV: regular rate and rhythm, S1-S2, no murmur, rub or gallop, no bruits, peripheral pulses normal and symmetric, no cyanosis, clubbing, edema or varicosities Chest: no scars, masses, no gynecomastia   GI: soft, non-tender; no hepatosplenomegaly, masses; active bowel sounds all quadrants GU: no hernia, testicular mass, penile discharge, priapism or prostate enlargement Lymph: no cervical, axillary or inguinal adenopathy MSK: gait normal, muscle tone and strength WNL, no joint swelling, effusions, discoloration, crepitus  SKIN: clear, good turgor, color WNL. Mobile 1 cm area upper L forehead. Neuro: normal mental status, normal strength, sensation, and motion Psych: alert; oriented to person, place and time, normally interactive and not anxious or depressed in appearance.    Assessment & Plan:   1. Routine general medical examination at a health care facility : The patient's preventative maintenance and recommended screening tests for an annual wellness exam were reviewed in full today. Brought up to date unless services declined.  Counselled on the importance of diet, exercise, and its  role in overall health and mortality. The patient's FH and SH was reviewed, including their home life, tobacco status, and drug and alcohol status.    2. Special screening for malignant neoplasm of colon : Colonoscopy referral Ambulatory referral to Gastroenterology  3. ERECTILE DYSFUNCTION : trial of cialis. Given coupon for cialis 5 mg. Try at 5, and can titrate up if needed.   4. HYPERTENSION : d/c atenolol given ED concerns. Change to HCTZ 12.5 mg, along with Ca channel blocker   5. HYPERLIPIDEMIA : doing ok off meds   6. Decreased libido

## 2011-04-18 NOTE — Patient Instructions (Signed)
REFERRAL: GO THE THE FRONT ROOM AT THE ENTRANCE OF OUR CLINIC, NEAR CHECK IN. ASK FOR MARION. SHE WILL HELP YOU SET UP YOUR REFERRAL. DATE: TIME:  

## 2011-04-23 ENCOUNTER — Other Ambulatory Visit: Payer: Self-pay | Admitting: *Deleted

## 2011-04-23 MED ORDER — ZOLPIDEM TARTRATE 10 MG PO TABS: 10.0000 mg | ORAL_TABLET | Freq: Every evening | ORAL | Status: AC | PRN

## 2011-04-23 NOTE — Telephone Encounter (Signed)
Ok to refill 30, 2 refills 

## 2011-04-23 NOTE — Telephone Encounter (Signed)
rx called to pharmacy ([facility])

## 2011-04-23 NOTE — Telephone Encounter (Signed)
Last refilled: 03/15/2011

## 2011-06-03 ENCOUNTER — Other Ambulatory Visit: Payer: Self-pay | Admitting: *Deleted

## 2011-06-03 ENCOUNTER — Other Ambulatory Visit: Payer: Self-pay | Admitting: Family Medicine

## 2011-06-03 MED ORDER — VENLAFAXINE HCL ER 150 MG PO CP24: 150.0000 mg | ORAL_CAPSULE | Freq: Every day | ORAL | Status: DC

## 2011-06-07 LAB — PROTIME-INR
INR: 1
Prothrombin Time: 13.8

## 2011-06-07 LAB — URINALYSIS, ROUTINE W REFLEX MICROSCOPIC
Glucose, UA: NEGATIVE
Ketones, ur: NEGATIVE
Nitrite: NEGATIVE
Protein, ur: NEGATIVE
Urobilinogen, UA: 0.2

## 2011-06-07 LAB — DIFFERENTIAL
Basophils Absolute: 0.1
Eosinophils Absolute: 0.3
Eosinophils Relative: 4
Lymphocytes Relative: 33
Neutrophils Relative %: 56

## 2011-06-07 LAB — APTT: aPTT: 39 — ABNORMAL HIGH

## 2011-06-07 LAB — CBC
HCT: 45.1
MCV: 88.4
Platelets: 307
RDW: 14.5
WBC: 6.7

## 2011-06-07 LAB — BASIC METABOLIC PANEL
BUN: 10
Creatinine, Ser: 1.19
GFR calc non Af Amer: >60
Glucose, Bld: 191 — ABNORMAL HIGH
Potassium: 4.3

## 2011-06-10 ENCOUNTER — Other Ambulatory Visit: Payer: Self-pay | Admitting: *Deleted

## 2011-06-10 MED ORDER — VERAPAMIL HCL ER 360 MG PO CP24: ORAL_CAPSULE | ORAL | Status: DC

## 2011-06-10 MED ORDER — HYDROCHLOROTHIAZIDE 12.5 MG PO TABS: 12.5000 mg | ORAL_TABLET | Freq: Every day | ORAL | Status: AC

## 2011-06-10 NOTE — Telephone Encounter (Signed)
Phoned request from [facility], [facility]

## 2011-06-11 MED ORDER — VENLAFAXINE HCL ER 150 MG PO CP24: 150.0000 mg | ORAL_CAPSULE | Freq: Every day | ORAL | Status: AC

## 2011-06-14 ENCOUNTER — Other Ambulatory Visit: Payer: Self-pay | Admitting: Family Medicine

## 2011-06-20 ENCOUNTER — Ambulatory Visit (INDEPENDENT_AMBULATORY_CARE_PROVIDER_SITE_OTHER): Payer: PRIVATE HEALTH INSURANCE

## 2011-06-20 DIAGNOSIS — Z23 Encounter for immunization: Secondary | ICD-10-CM

## 2011-07-05 ENCOUNTER — Other Ambulatory Visit: Payer: Self-pay | Admitting: *Deleted

## 2011-07-05 NOTE — Telephone Encounter (Signed)
Pt states he was given a written script for cialis, along with a free coupon, by Dr. Patsy Lager.  He has lost these and is asking if he can have a new script and coupon.  I advised him he may need to wait until Dr. Patsy Lager, but that I would check with you.  Please advise.

## 2011-07-05 NOTE — Telephone Encounter (Signed)
Okay to await Dr. Salena Saner return.

## 2011-07-08 ENCOUNTER — Telehealth: Payer: Self-pay | Admitting: Family Medicine

## 2011-07-08 MED ORDER — TADALAFIL 20 MG PO TABS: ORAL_TABLET | ORAL | Status: AC

## 2011-07-08 NOTE — Telephone Encounter (Signed)
rx called to pharmacy and patient advised 

## 2011-07-08 NOTE — Telephone Encounter (Signed)
Needs to pick up coupon and prescription.  Spoke with a nurse on Friday and is waiting for call back.  Patient can be called at [phone].

## 2011-07-08 NOTE — Telephone Encounter (Signed)
Can call in cialis 20 mg, 1/2 to 1 tab po 1 hour prior to intercourse, #10, prn refills  He can go to the [employer] website to try to print off a coupon.

## 2011-07-08 NOTE — Telephone Encounter (Signed)
Patient advised.

## 2013-02-15 IMAGING — CR DG CHEST 2V
2 series · 2 of 2 positions shown · non-contrast
Comparison: 03/10/2008

CLINICAL DATA: Preop for cervical spine surgery.  No chest
complaints.  Ex-smoker.

CHEST - 2 VIEW

[view not recorded (1 of 2)]
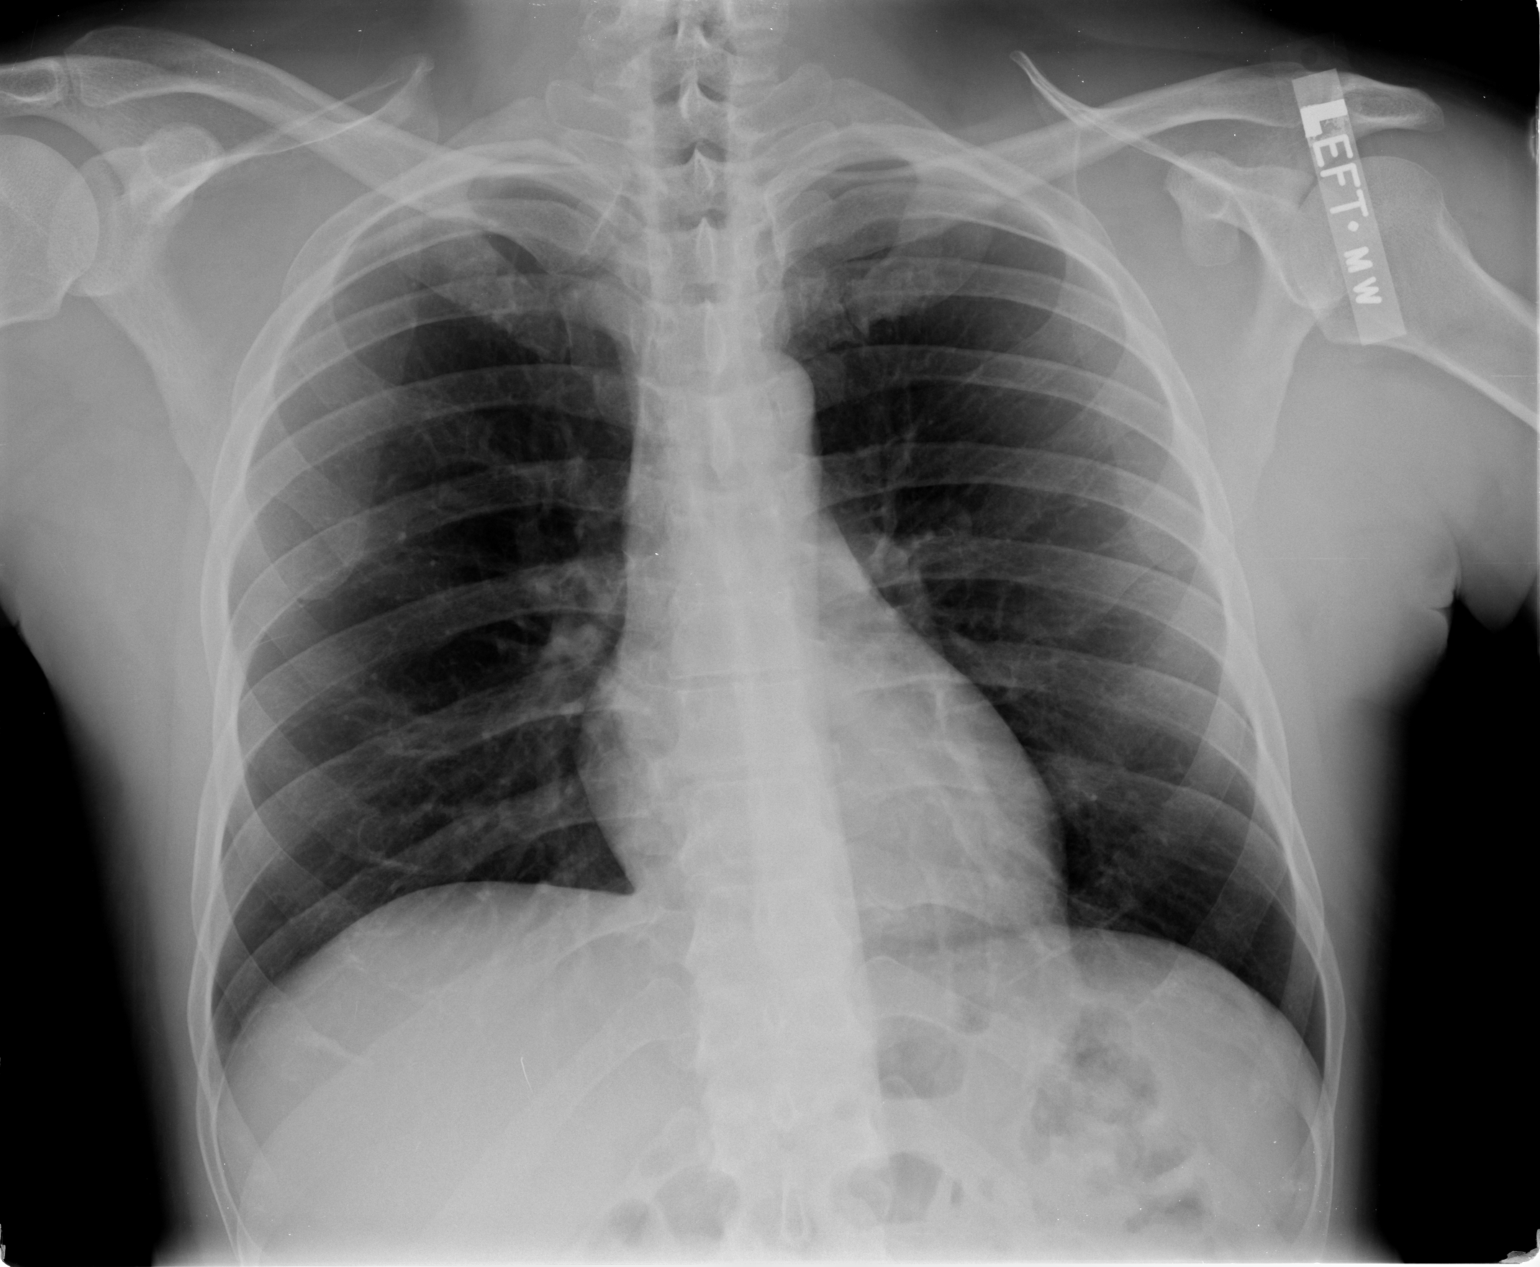

[view not recorded (2 of 2)]
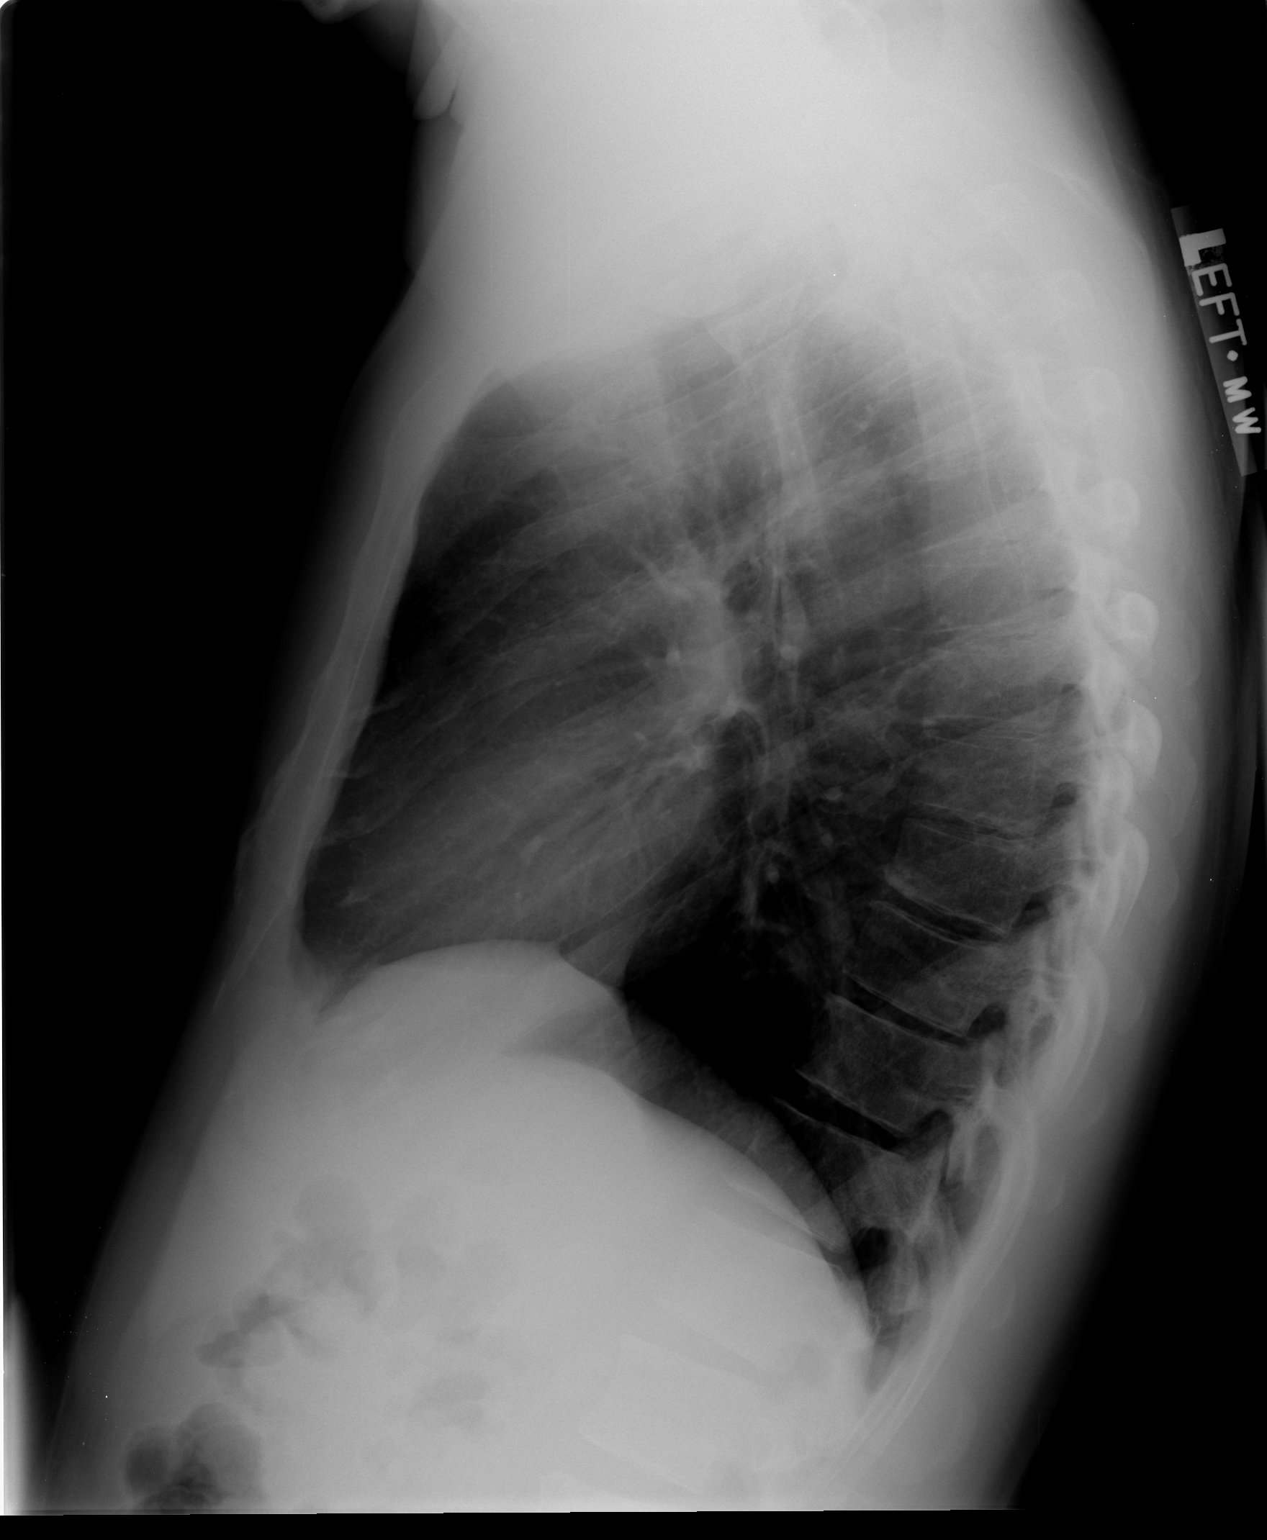

[2 of 2 positions shown; findings below may reference images not displayed]

FINDINGS: Midline trachea.  Normal heart size and mediastinal
contours. No pleural effusion or pneumothorax.  Clear lungs.
IMPRESSION: Normal chest.

## 2016-10-09 ENCOUNTER — Telehealth (INDEPENDENT_AMBULATORY_CARE_PROVIDER_SITE_OTHER): Payer: Self-pay | Admitting: Specialist

## 2016-10-09 NOTE — Telephone Encounter (Signed)
Patient called stating he needs a copy of his records.he asked that they be mailed to him [address]  [name]  [place]  [phone].  I have mailed him his records per his request.
